# Patient Record
Sex: Female | Born: 1985 | Race: White | Hispanic: No | Marital: Married | Smoking: Never smoker
Health system: Southern US, Community
[De-identification: ages and names within clinical notes are randomized; demographics above are authoritative.]

## PROBLEM LIST (undated history)

## (undated) DIAGNOSIS — R519 Headache, unspecified: Secondary | ICD-10-CM

## (undated) DIAGNOSIS — G43909 Migraine, unspecified, not intractable, without status migrainosus: Secondary | ICD-10-CM

## (undated) DIAGNOSIS — Z808 Family history of malignant neoplasm of other organs or systems: Secondary | ICD-10-CM

## (undated) DIAGNOSIS — R51 Headache: Secondary | ICD-10-CM

## (undated) DIAGNOSIS — E039 Hypothyroidism, unspecified: Secondary | ICD-10-CM

## (undated) DIAGNOSIS — T7840XA Allergy, unspecified, initial encounter: Secondary | ICD-10-CM

## (undated) DIAGNOSIS — J45909 Unspecified asthma, uncomplicated: Secondary | ICD-10-CM

## (undated) HISTORY — DX: Family history of malignant neoplasm of other organs or systems: Z80.8

## (undated) HISTORY — DX: Headache: R51

## (undated) HISTORY — DX: Migraine, unspecified, not intractable, without status migrainosus: G43.909

## (undated) HISTORY — PX: WISDOM TOOTH EXTRACTION: SHX21

## (undated) HISTORY — DX: Headache, unspecified: R51.9

## (undated) HISTORY — DX: Allergy, unspecified, initial encounter: T78.40XA

---
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
## 2010-03-30 ENCOUNTER — Encounter: Admission: RE | Admit: 2010-03-30 | Discharge: 2010-03-30 | Payer: Self-pay | Admitting: Obstetrics and Gynecology

## 2010-10-25 ENCOUNTER — Encounter
Admission: RE | Admit: 2010-10-25 | Discharge: 2010-10-25 | Payer: Self-pay | Source: Home / Self Care | Attending: Obstetrics and Gynecology | Admitting: Obstetrics and Gynecology

## 2011-02-27 ENCOUNTER — Inpatient Hospital Stay (INDEPENDENT_AMBULATORY_CARE_PROVIDER_SITE_OTHER)
Admission: RE | Admit: 2011-02-27 | Discharge: 2011-02-27 | Disposition: A | Discharge status: Home / Self Care | Payer: 59 | Source: Ambulatory Visit | Attending: Family Medicine | Admitting: Family Medicine

## 2011-02-27 DIAGNOSIS — N390 Urinary tract infection, site not specified: Secondary | ICD-10-CM

## 2011-02-27 LAB — POCT PREGNANCY, URINE: Preg Test, Ur: NEGATIVE

## 2011-02-27 LAB — POCT URINALYSIS DIP (DEVICE)
Glucose, UA: 100 mg/dL — AB
Nitrite: POSITIVE — AB
Urobilinogen, UA: 2 mg/dL — ABNORMAL HIGH (ref 0.0–1.0)

## 2011-08-24 ENCOUNTER — Other Ambulatory Visit: Payer: Self-pay | Admitting: Obstetrics and Gynecology

## 2011-08-24 DIAGNOSIS — D249 Benign neoplasm of unspecified breast: Secondary | ICD-10-CM

## 2011-09-01 ENCOUNTER — Ambulatory Visit
Admission: RE | Admit: 2011-09-01 | Discharge: 2011-09-01 | Disposition: A | Discharge status: Home / Self Care | Payer: 59 | Source: Ambulatory Visit | Attending: Obstetrics and Gynecology | Admitting: Obstetrics and Gynecology

## 2011-09-01 DIAGNOSIS — D249 Benign neoplasm of unspecified breast: Secondary | ICD-10-CM

## 2011-09-22 ENCOUNTER — Encounter: Payer: Self-pay | Admitting: *Deleted

## 2011-09-22 ENCOUNTER — Emergency Department (HOSPITAL_COMMUNITY)
Admission: EM | Admit: 2011-09-22 | Discharge: 2011-09-22 | Disposition: A | Discharge status: Home / Self Care | Payer: 59 | Source: Home / Self Care | Attending: Family Medicine | Admitting: Family Medicine

## 2011-09-22 DIAGNOSIS — J020 Streptococcal pharyngitis: Secondary | ICD-10-CM

## 2011-09-22 LAB — POCT RAPID STREP A: Streptococcus, Group A Screen (Direct): POSITIVE — AB

## 2011-09-22 MED ORDER — GUAIFENESIN-CODEINE 100-10 MG/5ML PO SYRP: 5.0000 mL | ORAL_SOLUTION | Freq: Three times a day (TID) | ORAL | Status: AC | PRN

## 2011-09-22 MED ORDER — AZITHROMYCIN 250 MG PO TABS: ORAL_TABLET | ORAL | Status: AC

## 2011-09-22 NOTE — ED Notes (Signed)
Pt  Has  Symptoms  Of  sorethroat        X  5  Days         Cough /  Congested         Runny  Nose  /  Sneezing

## 2011-09-22 NOTE — ED Provider Notes (Signed)
History     CSN: [id number] Arrival date & time: 09/22/2011  3:43 PM   None     No chief complaint on file.   (Consider location/radiation/quality/duration/timing/severity/associated sxs/prior treatment) Patient is a 25 y.o. female presenting with pharyngitis.  Sore Throat This is a new problem. The current episode started more than 2 days ago. The problem occurs constantly. The problem has been gradually worsening. The symptoms are relieved by nothing.    No past medical history on file.  No past surgical history on file.  No family history on file.  History  Substance Use Topics  . Smoking status: Not on file  . Smokeless tobacco: Not on file  . Alcohol Use: Not on file    OB History    No data available      Review of Systems  Constitutional: Negative.   HENT: Positive for congestion, sore throat and postnasal drip.   Respiratory: Negative.   Cardiovascular: Negative.   Gastrointestinal: Negative.   Skin: Negative.     Allergies  Review of patient's allergies indicates not on file.  Home Medications  No current outpatient prescriptions on file.  There were no vitals taken for this visit.  Physical Exam  Nursing note and vitals reviewed. Constitutional: She appears well-developed and well-nourished.  HENT:  Head: Normocephalic.  Right Ear: External ear normal.  Left Ear: External ear normal.  Mouth/Throat: Mucous membranes are normal. Posterior oropharyngeal erythema present. No oropharyngeal exudate, posterior oropharyngeal edema or tonsillar abscesses.    ED Course  Procedures (including critical care time)  Labs Reviewed - No data to display No results found.   No diagnosis found.    MDM  Strep pos.        Barkley Bruns, MD 09/22/11 [phone]

## 2012-07-25 ENCOUNTER — Other Ambulatory Visit: Payer: Self-pay

## 2012-07-25 LAB — OB RESULTS CONSOLE RUBELLA ANTIBODY, IGM: Rubella: IMMUNE

## 2012-07-25 LAB — OB RESULTS CONSOLE HEPATITIS B SURFACE ANTIGEN: Hepatitis B Surface Ag: NEGATIVE

## 2012-07-25 LAB — OB RESULTS CONSOLE GC/CHLAMYDIA
Chlamydia: NEGATIVE
Gonorrhea: NEGATIVE

## 2012-07-25 LAB — OB RESULTS CONSOLE ANTIBODY SCREEN: Antibody Screen: NEGATIVE

## 2012-10-07 ENCOUNTER — Other Ambulatory Visit (HOSPITAL_COMMUNITY): Payer: Self-pay | Admitting: Obstetrics and Gynecology

## 2012-10-07 DIAGNOSIS — Z3689 Encounter for other specified antenatal screening: Secondary | ICD-10-CM

## 2012-10-11 ENCOUNTER — Ambulatory Visit (HOSPITAL_COMMUNITY)
Admission: RE | Admit: 2012-10-11 | Discharge: 2012-10-11 | Disposition: A | Discharge status: Home / Self Care | Payer: 59 | Source: Ambulatory Visit | Attending: Obstetrics and Gynecology | Admitting: Obstetrics and Gynecology

## 2012-10-11 ENCOUNTER — Encounter (HOSPITAL_COMMUNITY): Payer: Self-pay

## 2012-10-11 DIAGNOSIS — O3440 Maternal care for other abnormalities of cervix, unspecified trimester: Secondary | ICD-10-CM | POA: Insufficient documentation

## 2012-10-11 DIAGNOSIS — Z363 Encounter for antenatal screening for malformations: Secondary | ICD-10-CM | POA: Insufficient documentation

## 2012-10-11 DIAGNOSIS — Z1389 Encounter for screening for other disorder: Secondary | ICD-10-CM | POA: Insufficient documentation

## 2012-10-11 DIAGNOSIS — O358XX0 Maternal care for other (suspected) fetal abnormality and damage, not applicable or unspecified: Secondary | ICD-10-CM | POA: Insufficient documentation

## 2012-10-11 DIAGNOSIS — Z3689 Encounter for other specified antenatal screening: Secondary | ICD-10-CM

## 2012-10-11 NOTE — Progress Notes (Signed)
Ms. Allison Hicks was seen for ultrasound appointment today.  Please see [facility] report for details.

## 2013-03-03 ENCOUNTER — Inpatient Hospital Stay (HOSPITAL_COMMUNITY)
Admission: AD | Admit: 2013-03-03 | Discharge: 2013-03-07 | DRG: 775 | Disposition: A | Discharge status: Home / Self Care | Payer: 59 | Source: Ambulatory Visit | Attending: Obstetrics and Gynecology | Admitting: Obstetrics and Gynecology

## 2013-03-03 ENCOUNTER — Encounter (HOSPITAL_COMMUNITY): Payer: Self-pay | Admitting: *Deleted

## 2013-03-03 HISTORY — DX: Unspecified asthma, uncomplicated: J45.909

## 2013-03-03 LAB — CBC
MCH: 31.2 pg (ref 26.0–34.0)
Platelets: 164 10*3/uL (ref 150–400)
RBC: 4.36 MIL/uL (ref 3.87–5.11)
WBC: 10.7 10*3/uL — ABNORMAL HIGH (ref 4.0–10.5)

## 2013-03-03 MED ORDER — LIDOCAINE HCL (PF) 1 % IJ SOLN
30.0000 mL | INTRAMUSCULAR | Status: AC | PRN
  Administered 2013-03-05: 30 mL via SUBCUTANEOUS
  Filled 2013-03-03 (×2): qty 30

## 2013-03-03 MED ORDER — OXYCODONE-ACETAMINOPHEN 5-325 MG PO TABS: 1.0000 | ORAL_TABLET | ORAL | Status: DC | PRN

## 2013-03-03 MED ORDER — OXYTOCIN 40 UNITS IN LACTATED RINGERS INFUSION - SIMPLE MED: 62.5000 mL/h | INTRAVENOUS | Status: DC

## 2013-03-03 MED ORDER — LACTATED RINGERS IV SOLN
INTRAVENOUS | Status: DC
  Administered 2013-03-03 – 2013-03-05 (×4): via INTRAVENOUS

## 2013-03-03 MED ORDER — TERBUTALINE SULFATE 1 MG/ML IJ SOLN: 0.2500 mg | Freq: Once | INTRAMUSCULAR | Status: AC | PRN

## 2013-03-03 MED ORDER — OXYTOCIN 40 UNITS IN LACTATED RINGERS INFUSION - SIMPLE MED
1.0000 m[IU]/min | INTRAVENOUS | Status: DC
  Administered 2013-03-04: 2 m[IU]/min via INTRAVENOUS
  Filled 2013-03-03: qty 1000

## 2013-03-03 MED ORDER — IBUPROFEN 600 MG PO TABS: 600.0000 mg | ORAL_TABLET | Freq: Four times a day (QID) | ORAL | Status: DC | PRN

## 2013-03-03 MED ORDER — ACETAMINOPHEN 325 MG PO TABS
650.0000 mg | ORAL_TABLET | ORAL | Status: DC | PRN
  Administered 2013-03-04 (×2): 650 mg via ORAL
  Filled 2013-03-03 (×2): qty 2

## 2013-03-03 MED ORDER — ONDANSETRON HCL 4 MG/2ML IJ SOLN
4.0000 mg | Freq: Four times a day (QID) | INTRAMUSCULAR | Status: DC | PRN
  Administered 2013-03-04 (×2): 4 mg via INTRAVENOUS
  Filled 2013-03-03 (×2): qty 2

## 2013-03-03 MED ORDER — OXYTOCIN BOLUS FROM INFUSION
500.0000 mL | INTRAVENOUS | Status: DC
  Administered 2013-03-05: 500 mL via INTRAVENOUS

## 2013-03-03 MED ORDER — LACTATED RINGERS IV SOLN: 500.0000 mL | INTRAVENOUS | Status: DC | PRN

## 2013-03-03 MED ORDER — MISOPROSTOL 25 MCG QUARTER TABLET
25.0000 ug | ORAL_TABLET | ORAL | Status: AC | PRN
  Administered 2013-03-03 (×2): 25 ug via VAGINAL
  Filled 2013-03-03 (×2): qty 0.25

## 2013-03-03 MED ORDER — CITRIC ACID-SODIUM CITRATE 334-500 MG/5ML PO SOLN
30.0000 mL | ORAL | Status: DC | PRN
  Filled 2013-03-03: qty 15

## 2013-03-03 MED ORDER — ZOLPIDEM TARTRATE 5 MG PO TABS: 5.0000 mg | ORAL_TABLET | Freq: Every evening | ORAL | Status: DC | PRN

## 2013-03-04 ENCOUNTER — Inpatient Hospital Stay (HOSPITAL_COMMUNITY): Payer: 59 | Admitting: Anesthesiology

## 2013-03-04 ENCOUNTER — Encounter (HOSPITAL_COMMUNITY): Payer: Self-pay | Admitting: Anesthesiology

## 2013-03-04 LAB — RPR: RPR Ser Ql: NONREACTIVE

## 2013-03-04 MED ORDER — EPHEDRINE 5 MG/ML INJ
10.0000 mg | INTRAVENOUS | Status: DC | PRN
  Filled 2013-03-04: qty 2

## 2013-03-04 MED ORDER — LIDOCAINE HCL (PF) 1 % IJ SOLN
INTRAMUSCULAR | Status: DC | PRN
  Administered 2013-03-04 (×2): 5 mL

## 2013-03-04 MED ORDER — LACTATED RINGERS IV SOLN
500.0000 mL | Freq: Once | INTRAVENOUS | Status: AC
  Administered 2013-03-04: 15:00:00 via INTRAVENOUS

## 2013-03-04 MED ORDER — DIPHENHYDRAMINE HCL 50 MG/ML IJ SOLN: 12.5000 mg | INTRAMUSCULAR | Status: DC | PRN

## 2013-03-04 MED ORDER — PHENYLEPHRINE 40 MCG/ML (10ML) SYRINGE FOR IV PUSH (FOR BLOOD PRESSURE SUPPORT)
80.0000 ug | PREFILLED_SYRINGE | INTRAVENOUS | Status: DC | PRN
  Filled 2013-03-04: qty 2
  Filled 2013-03-04: qty 5

## 2013-03-04 MED ORDER — EPHEDRINE 5 MG/ML INJ
10.0000 mg | INTRAVENOUS | Status: DC | PRN
  Filled 2013-03-04: qty 4
  Filled 2013-03-04: qty 2

## 2013-03-04 MED ORDER — PHENYLEPHRINE 40 MCG/ML (10ML) SYRINGE FOR IV PUSH (FOR BLOOD PRESSURE SUPPORT)
80.0000 ug | PREFILLED_SYRINGE | INTRAVENOUS | Status: DC | PRN
  Filled 2013-03-04: qty 2

## 2013-03-04 MED ORDER — FENTANYL 2.5 MCG/ML BUPIVACAINE 1/10 % EPIDURAL INFUSION (WH - ANES)
14.0000 mL/h | INTRAMUSCULAR | Status: DC | PRN
  Administered 2013-03-04 – 2013-03-05 (×3): 14 mL/h via EPIDURAL
  Filled 2013-03-04 (×3): qty 125

## 2013-03-04 MED ORDER — BUTORPHANOL TARTRATE 1 MG/ML IJ SOLN: 1.0000 mg | INTRAMUSCULAR | Status: DC | PRN

## 2013-03-04 NOTE — Progress Notes (Signed)
Cx 3/C/-2 FHT reactive UCs q2-4 min

## 2013-03-04 NOTE — Progress Notes (Signed)
SROM about 11 am, clear FHT reactive UCs about q2-3 min Pitocin on Cx 1/80/-3/vtx/soft D/W patient and husband-continue present treatment

## 2013-03-04 NOTE — Anesthesia Preprocedure Evaluation (Signed)

## 2013-03-04 NOTE — Progress Notes (Signed)
Cx 2/90/-3 IUPC placed FHT reactive

## 2013-03-04 NOTE — Anesthesia Procedure Notes (Signed)
Epidural Patient location during procedure: OB Start time: 03/04/2013 3:10 PM  Staffing Anesthesiologist: Angus Seller., Harrell Gave. Performed by: anesthesiologist   Preanesthetic Checklist Completed: patient identified, site marked, surgical consent, pre-op evaluation, timeout performed, IV checked, risks and benefits discussed and monitors and equipment checked  Epidural Patient position: sitting Prep: site prepped and draped and DuraPrep Patient monitoring: continuous pulse ox and blood pressure Approach: midline Injection technique: LOR air and LOR saline  Needle:  Needle type: Tuohy  Needle gauge: 17 G Needle length: 9 cm and 9 Needle insertion depth: 5 cm cm Catheter type: closed end flexible Catheter size: 19 Gauge Catheter at skin depth: 10 cm Test dose: negative  Assessment Events: blood not aspirated, injection not painful, no injection resistance, negative IV test and no paresthesia  Additional Notes Patient identified.  Risk benefits discussed including failed block, incomplete pain control, headache, nerve damage, paralysis, blood pressure changes, nausea, vomiting, reactions to medication both toxic or allergic, and postpartum back pain.  Patient expressed understanding and wished to proceed.  All questions were answered.  Sterile technique used throughout procedure and epidural site dressed with sterile barrier dressing. No paresthesia or other complications noted.The patient did not experience any signs of intravascular injection such as tinnitus or metallic taste in mouth nor signs of intrathecal spread such as rapid motor block. Please see nursing notes for vital signs.

## 2013-03-04 NOTE — Progress Notes (Signed)
Cx 4/C (slightly puffy at 9:00) FHT reactive UCs q2-3 min, IUPC flushed Check cx 1 hour

## 2013-03-04 NOTE — H&P (Signed)
Allison Hicks is a 27 y.o. female presenting for IOL. No HA, no vision change, no epigastric pain, no ROM/bleeding. Maternal Medical History:  Fetal activity: Perceived fetal activity is normal.      OB History   Grav Para Term Preterm Abortions TAB SAB Ect Mult Living   1              Past Medical History  Diagnosis Date  . Asthma    No past surgical history on file. Family History: family history is not on file. Social History:  reports that she has never smoked. She does not have any smokeless tobacco history on file. She reports that she does not drink alcohol or use illicit drugs.   Prenatal Transfer Tool  Maternal Diabetes: No Genetic Screening: Normal Maternal Ultrasounds/Referrals: Normal Fetal Ultrasounds or other Referrals:  None Maternal Substance Abuse:  No Significant Maternal Medications:  None Significant Maternal Lab Results:  None Other Comments:  None  Review of Systems  Eyes: Negative for blurred vision.  Gastrointestinal: Negative for abdominal pain.  Neurological: Negative for headaches.    Dilation: Fingertip Effacement (%): Thick Station: -2 Exam by:: L. Dupell, RN Blood pressure 114/73, pulse 80, temperature 98.1 F (36.7 C), temperature source Oral, resp. rate 20, height 5\' 6"  (1.676 m), weight 180 lb (81.647 kg), last menstrual period 05/25/2012. Maternal Exam:  Uterine Assessment: Contraction strength is moderate.  Contraction frequency is irregular.   Abdomen: Fetal presentation: vertex     Fetal Exam Fetal Monitor Review: Pattern: accelerations present.       Physical Exam  Cardiovascular: Normal rate and regular rhythm.   Respiratory: Effort normal and breath sounds normal.  GI: There is no tenderness.  Neurological: She has normal reflexes.    Prenatal labs: ABO, Rh: O/Positive/-- (09/19 0000) Antibody: Negative (09/19 0000) Rubella: Immune (09/19 0000) RPR: NON REACTIVE (04/28 1820)  HBsAg: Negative (09/19 0000)   HIV: Non-reactive (09/19 0000)  GBS: Negative (04/28 0000)   Assessment/Plan: 27 yo G1P0 at 40 3/7 weeks for IOL Two stage induction D/W patient and husband-risks reviewed including hyperstimulation, fetal distress, emergency cesarean section and failed induction. S/P cytotec x 2 last pm, now on pitocin   Allison Hicks,Huntley Demedeiros E 03/04/2013, 7:53 AM

## 2013-03-05 ENCOUNTER — Encounter (HOSPITAL_COMMUNITY): Payer: Self-pay | Admitting: *Deleted

## 2013-03-05 MED ORDER — METHYLERGONOVINE MALEATE 0.2 MG PO TABS: 0.2000 mg | ORAL_TABLET | ORAL | Status: DC | PRN

## 2013-03-05 MED ORDER — ZOLPIDEM TARTRATE 5 MG PO TABS: 5.0000 mg | ORAL_TABLET | Freq: Every evening | ORAL | Status: DC | PRN

## 2013-03-05 MED ORDER — DIPHENHYDRAMINE HCL 25 MG PO CAPS: 25.0000 mg | ORAL_CAPSULE | Freq: Four times a day (QID) | ORAL | Status: DC | PRN

## 2013-03-05 MED ORDER — ONDANSETRON HCL 4 MG/2ML IJ SOLN: 4.0000 mg | INTRAMUSCULAR | Status: DC | PRN

## 2013-03-05 MED ORDER — PRENATAL MULTIVITAMIN CH
1.0000 | ORAL_TABLET | Freq: Every day | ORAL | Status: DC
  Administered 2013-03-05 – 2013-03-06 (×2): 1 via ORAL
  Filled 2013-03-05 (×3): qty 1

## 2013-03-05 MED ORDER — BISACODYL 10 MG RE SUPP: 10.0000 mg | Freq: Every day | RECTAL | Status: DC | PRN

## 2013-03-05 MED ORDER — BENZOCAINE-MENTHOL 20-0.5 % EX AERO
1.0000 "application " | INHALATION_SPRAY | CUTANEOUS | Status: DC | PRN
  Administered 2013-03-05: 1 via TOPICAL
  Filled 2013-03-05: qty 56

## 2013-03-05 MED ORDER — ONDANSETRON HCL 4 MG PO TABS: 4.0000 mg | ORAL_TABLET | ORAL | Status: DC | PRN

## 2013-03-05 MED ORDER — DIBUCAINE 1 % RE OINT: 1.0000 "application " | TOPICAL_OINTMENT | RECTAL | Status: DC | PRN

## 2013-03-05 MED ORDER — WITCH HAZEL-GLYCERIN EX PADS: 1.0000 "application " | MEDICATED_PAD | CUTANEOUS | Status: DC | PRN

## 2013-03-05 MED ORDER — FLEET ENEMA 7-19 GM/118ML RE ENEM: 1.0000 | ENEMA | Freq: Every day | RECTAL | Status: DC | PRN

## 2013-03-05 MED ORDER — LANOLIN HYDROUS EX OINT: TOPICAL_OINTMENT | CUTANEOUS | Status: DC | PRN

## 2013-03-05 MED ORDER — SIMETHICONE 80 MG PO CHEW
80.0000 mg | CHEWABLE_TABLET | ORAL | Status: DC | PRN
Start: 2013-03-05 — End: 2013-03-07

## 2013-03-05 MED ORDER — OXYCODONE-ACETAMINOPHEN 5-325 MG PO TABS
1.0000 | ORAL_TABLET | ORAL | Status: DC | PRN
  Administered 2013-03-06 – 2013-03-07 (×2): 1 via ORAL
  Filled 2013-03-05: qty 1

## 2013-03-05 MED ORDER — METHYLERGONOVINE MALEATE 0.2 MG/ML IJ SOLN: 0.2000 mg | INTRAMUSCULAR | Status: DC | PRN

## 2013-03-05 MED ORDER — IBUPROFEN 600 MG PO TABS
600.0000 mg | ORAL_TABLET | Freq: Four times a day (QID) | ORAL | Status: DC
  Administered 2013-03-05 – 2013-03-07 (×9): 600 mg via ORAL
  Filled 2013-03-05 (×9): qty 1

## 2013-03-05 MED ORDER — SENNOSIDES-DOCUSATE SODIUM 8.6-50 MG PO TABS
2.0000 | ORAL_TABLET | Freq: Every day | ORAL | Status: DC
  Administered 2013-03-05 – 2013-03-06 (×2): 2 via ORAL

## 2013-03-05 MED ORDER — TETANUS-DIPHTH-ACELL PERTUSSIS 5-2.5-18.5 LF-MCG/0.5 IM SUSP: 0.5000 mL | Freq: Once | INTRAMUSCULAR | Status: DC

## 2013-03-05 NOTE — Progress Notes (Signed)
Dr Henderson Cloud called for update, pt complete and pushing, to call when ready for delivery

## 2013-03-05 NOTE — Anesthesia Postprocedure Evaluation (Signed)
  Anesthesia Post-op Note  Patient: Allison Hicks  Procedure(s) Performed: * No procedures listed *  Patient Location: PACU and Mother/Baby  Anesthesia Type:Epidural  Level of Consciousness: awake, alert  and oriented  Airway and Oxygen Therapy: Patient Spontanous Breathing  Post-op Pain: none  Post-op Assessment: Post-op Vital signs reviewed, Patient's Cardiovascular Status Stable, No headache, No backache, No residual numbness and No residual motor weakness  Post-op Vital Signs: Reviewed and stable  Complications: No apparent anesthesia complications Anesthesia Post-op Note  Patient: Allison Hicks  Procedure(s) Performed: * No procedures listed *  Patient Location: PACU and Mother/Baby  Anesthesia Type:Epidural  Level of Consciousness: awake, alert  and oriented  Airway and Oxygen Therapy: Patient Spontanous Breathing  Post-op Pain: none  Post-op Assessment: Post-op Vital signs reviewed, Patient's Cardiovascular Status Stable, No headache, No backache, No residual numbness and No residual motor weakness  Post-op Vital Signs: Reviewed and stable  Complications: No apparent anesthesia complications

## 2013-03-05 NOTE — Progress Notes (Signed)
Operative Delivery Note At 6:56 AM a viable female was delivered via Vaginal, Vacuum Investment banker, operational).  Presentation: vertex; Position: Left,, Occiput,, Anterior; Station: +3.  Verbal consent: obtained from patient.  Risks and benefits discussed in detail.  Risks include, but are not limited to the risks of anesthesia, bleeding, infection, damage to maternal tissues, fetal cephalhematoma.  There is also the risk of inability to effect vaginal delivery of the head, or shoulder dystocia that cannot be resolved by established maneuvers, leading to the need for emergency cesarean section.  APGAR: 9, 9; weight .   Placenta status: Intact, Spontaneous.   Cord: 3 vessels with the following complications: None.  Cord pH: pending  Anesthesia: Epidural  Instruments: Kiwi Episiotomy: Median Lacerations: 2nd degree Suture Repair: vicryl rapide Est. Blood Loss (mL): 350  Mom to postpartum.  Baby to nursery-stable.  Allison Hicks,Allison Hicks E 03/05/2013, 7:24 AM

## 2013-03-05 NOTE — Progress Notes (Signed)
Dr Henderson Cloud at bedside discussing with pt risks and benefits of vacuum assisted delivery, pt verbalizes and understands, to proceed with vacuum delivery, see dr note.

## 2013-03-06 ENCOUNTER — Encounter (HOSPITAL_COMMUNITY): Payer: Self-pay | Admitting: *Deleted

## 2013-03-06 LAB — CBC
HCT: 32.8 % — ABNORMAL LOW (ref 36.0–46.0)
Hemoglobin: 11.1 g/dL — ABNORMAL LOW (ref 12.0–15.0)
MCV: 90.4 fL (ref 78.0–100.0)
RBC: 3.63 MIL/uL — ABNORMAL LOW (ref 3.87–5.11)
RDW: 13.3 % (ref 11.5–15.5)
WBC: 15.7 10*3/uL — ABNORMAL HIGH (ref 4.0–10.5)

## 2013-03-06 NOTE — Progress Notes (Signed)
Post Partum Day 1 Subjective: no complaints, up ad lib, voiding, tolerating PO and + flatus  Objective: Blood pressure 101/68, pulse 72, temperature 97.9 F (36.6 C), temperature source Oral, resp. rate 17, height 5\' 6"  (1.676 m), weight 180 lb (81.647 kg), last menstrual period 05/25/2012, SpO2 100.00%, unknown if currently breastfeeding.  Physical Exam:  General: alert, cooperative and appears stated age Lochia: appropriate Uterine Fundus: firm Incision: perineum intact DVT Evaluation: No evidence of DVT seen on physical exam. Negative Homan's sign. No cords or calf tenderness. No significant calf/ankle edema.   Recent Labs  03/03/13 1820 03/06/13 0615  HGB 13.6 11.1*  HCT 38.7 32.8*    Assessment/Plan: Plan for discharge tomorrow and Circumcision prior to discharge   LOS: 3 days   Jalal Rauch G 03/06/2013, 7:56 AM

## 2013-03-07 MED ORDER — IBUPROFEN 600 MG PO TABS: 600.0000 mg | ORAL_TABLET | Freq: Four times a day (QID) | ORAL | Status: DC

## 2013-03-07 NOTE — Discharge Summary (Signed)
Obstetric Discharge Summary Reason for Admission: induction of labor Prenatal Procedures: ultrasound Intrapartum Procedures: vacuum Postpartum Procedures: none Complications-Operative and Postpartum: 2 degree perineal laceration Hemoglobin  Date Value Range Status  03/06/2013 11.1* 12.0 - 15.0 g/dL Final     HCT  Date Value Range Status  03/06/2013 32.8* 36.0 - 46.0 % Final    Physical Exam:  General: alert, cooperative and appears stated age Lochia: appropriate Uterine Fundus: firm Incision: perineum intact DVT Evaluation: No evidence of DVT seen on physical exam. Negative Homan's sign. No cords or calf tenderness. No significant calf/ankle edema.  Discharge Diagnoses: Term Pregnancy-delivered  Discharge Information: Date: 03/07/2013 Activity: pelvic rest Diet: routine Medications: None and Ibuprofen Condition: stable Instructions: refer to practice specific booklet Discharge to: home, desires circ prior to discharge   Newborn Data: Live born female  Birth Weight: 8 lb 2.4 oz (3697 g) APGAR: 9, 9  Home with mother.  CURTIS,CAROL G 03/07/2013, 7:56 AM

## 2013-03-10 NOTE — Progress Notes (Signed)
Post discharge chart review completed.  

## 2013-03-18 ENCOUNTER — Ambulatory Visit (HOSPITAL_COMMUNITY)
Admission: RE | Admit: 2013-03-18 | Discharge: 2013-03-18 | Disposition: A | Discharge status: Home / Self Care | Payer: 59 | Source: Ambulatory Visit | Attending: Obstetrics and Gynecology | Admitting: Obstetrics and Gynecology

## 2013-03-18 NOTE — Lactation Note (Signed)
Adult Lactation Consultation Outpatient Visit Note  Patient Name: Allison Hicks       "Samuel Bouche"                                                                 todays weight:8-13.8,4020 Date of Birth: 03/08/1986 Gestational Age at Delivery: [redacted]w[redacted]d Type of Delivery:   Breastfeeding History: Frequency of Breastfeeding: offers breast once daily for a few mins Length of Feeding:  Voids: 15 Stools:3 yellow brown   Supplementing / Method: Pumping:  Type of Pump:Medela advance   Frequency:every 4 hours  Volume:  4 ounces  Comments:Infant is 24 weeks old and one day. Infant was jaundice and on bili blanket. Infant is bili count is now under 12. jaundice is still observed on face and in eyes. Mother here today for assistance with difficult latch. Mother purchased a #20 and #24 nipple shields. She has been using them for several days. She describes severe nipple pain when latching infant. Mother nipples are very pink. No cracking observed. Mothers breast are filling but not firm. She states she pumps every 3-4 hours. She states that pumping is painful.   Consultation Evaluation: Mother assisted with latch using a #24 nipple shield. Infant unable to sustain deep latch. Place #20 nipple shield on and infant latched for 10-58mins on and off and transferred 10 ml. Latched much deeper with #20 NS. Attempt to latch infant without shield. Unable to sustain comfortable latch. When infant comes off, mothers nipple is blanched and pinched.   Initial Feeding Assessment: Pre-feed Weight:4020 Post-feed Weight:4030 Amount Transferred:39ml Comments:  Additional Feeding Assessment: infant fed using SNS and #20 nipple shield. Infant took 30 ml of formula and 22 ml from mothers breast. Mother denied pain with latch. Pre-feed Weight:4030 Post-feed ZOXWRU:0454 Amount Transferred:52 Comments:  Additional Feeding Assessment: infant was given another 40 ml of formula in SNS. Infant took entire 40 ml and 10 ml from  mother. Pre-feed [id number]:1478 Post-feed [id number]:3086 Amount Transferred:35ml Comments:  Total Breast milk Transferred this Visit: 42ml Total Supplement Given: 70ml Total feeding 12 ml  Additional Interventions: Mother encouraged to feed infant offer and early .  inst mother to offer breast using Sns and give at least 60-90 ml of EBM/ formula every 2-3 hours. After breast feeding. Encouraged mother to use good breast support , breast compression and apply nipple shield properly. Mother inst to pump every 2-3 hours for 20-25 mins. inst mother to hand express before and after each pumping to drain more milk from breast.  Suggested power pumping once. Mother to nap frequently and to drink lots of liquids.   Follow-Up  May 20 at 1:00 p,m.    Stevan Born [facility] 03/18/2013, 9:12 AM

## 2013-03-24 ENCOUNTER — Ambulatory Visit (HOSPITAL_COMMUNITY): Payer: 59

## 2013-03-25 ENCOUNTER — Ambulatory Visit (HOSPITAL_COMMUNITY): Payer: 59

## 2013-03-26 ENCOUNTER — Ambulatory Visit (HOSPITAL_COMMUNITY)
Admission: RE | Admit: 2013-03-26 | Discharge: 2013-03-26 | Disposition: A | Discharge status: Home / Self Care | Payer: 59 | Source: Ambulatory Visit | Attending: Obstetrics and Gynecology | Admitting: Obstetrics and Gynecology

## 2013-03-26 NOTE — Lactation Note (Signed)
Adult Lactation Consultation Outpatient Visit Note  Patient Name: Allison Hicks Date of Birth: [birth year] Gestational Age at Delivery: Unknown Type of Delivery:   Breastfeeding History: Frequency of Breastfeeding: Has been bottle feeding EBM and formula Length of Feeding:  Voids: QS Stools: None since Monday night- has already talked with Ped  Supplementing / Method: Pumping:  Type of Pump: Medela PIS   Frequency: q 3-4 hours during the day- none at night  Volume:  4-5 oz  Comments: Suggested mom may want to pump once during the night to promote milk supply. Has to give one bottle of formula at night    Consultation Evaluation:  Initial Feeding Assessment: Pre-feed Weight:9- 8.4  4320 Post-feed Weight: 9- 13.3  4460  Amount Transferred:120 cc's Comments: Attempted to latch Lucas without NS. Mom reports it feels like he is chewing and her nipple is stinging.Nipple pinched when Samuel Bouche comes off the breast. He is very fussy so tried with NS and he did a little better but fussy like he wasn't enough fast enough. Bottle fed EBM to calm baby. Tried both breasts with NS Mom reports that it feels better on the right. Does not look pinched when he comes off the breast. Samuel Bouche took about 3 1/2 oz from bottle. Mom reports that she tried SNS but it was just too much for her- couldn't get it to work well once she got home. Does not want to try it again. Plans to continue attempting at the breast with NS and will bottle feed EBM.      Total Breast milk Transferred this Visit:approx 34 cc's  Total Supplement Given: approx 3 1/2 oz  Additional Interventions: Plans to attend [facility] for weight checks and support. Has started taking Fen[place] since yesterday. No further questions at present. To call prn  Follow-Up With Ped     Pamelia Hoit 03/26/2013, 2:16 PM

## 2014-06-24 LAB — OB RESULTS CONSOLE HIV ANTIBODY (ROUTINE TESTING): HIV: NONREACTIVE

## 2014-06-24 LAB — OB RESULTS CONSOLE ABO/RH: RH TYPE: POSITIVE

## 2014-06-24 LAB — OB RESULTS CONSOLE RPR: RPR: NONREACTIVE

## 2014-06-24 LAB — OB RESULTS CONSOLE GC/CHLAMYDIA
Chlamydia: NEGATIVE
Gonorrhea: NEGATIVE

## 2014-06-24 LAB — OB RESULTS CONSOLE ANTIBODY SCREEN: Antibody Screen: NEGATIVE

## 2014-06-24 LAB — OB RESULTS CONSOLE HEPATITIS B SURFACE ANTIGEN: HEP B S AG: NEGATIVE

## 2014-06-24 LAB — OB RESULTS CONSOLE RUBELLA ANTIBODY, IGM: RUBELLA: IMMUNE

## 2014-09-07 ENCOUNTER — Encounter (HOSPITAL_COMMUNITY): Payer: Self-pay | Admitting: *Deleted

## 2014-09-15 IMAGING — US US OB DETAIL+14 WK
1 series · 12 of 28 positions shown · non-contrast
Comparison: none

[Series 1: us ob detail+14 wk · 0.21mm/px · 12 of 107 slices shown]
[im 4/107]
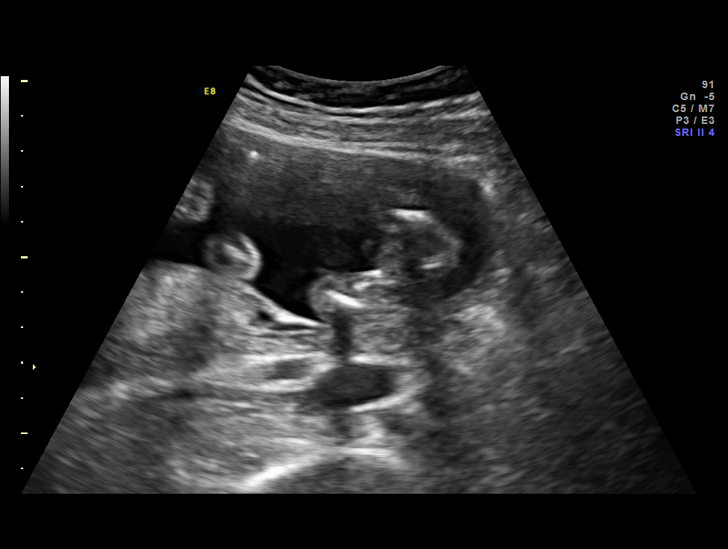
[im 12/107]
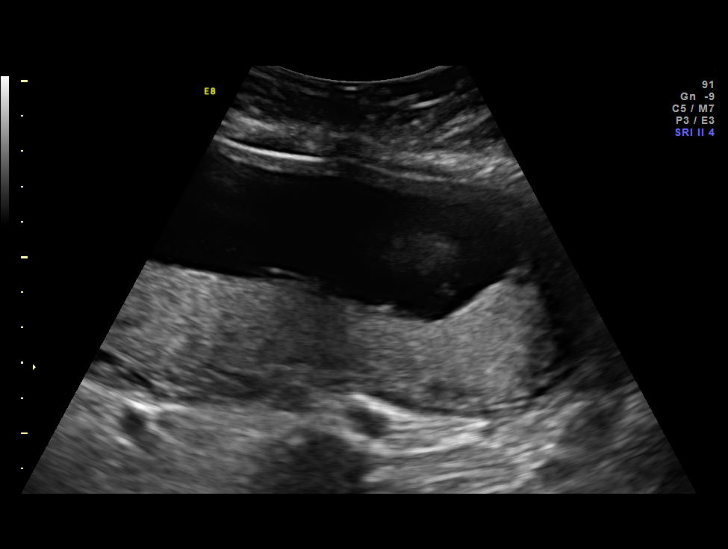
[im 20/107]
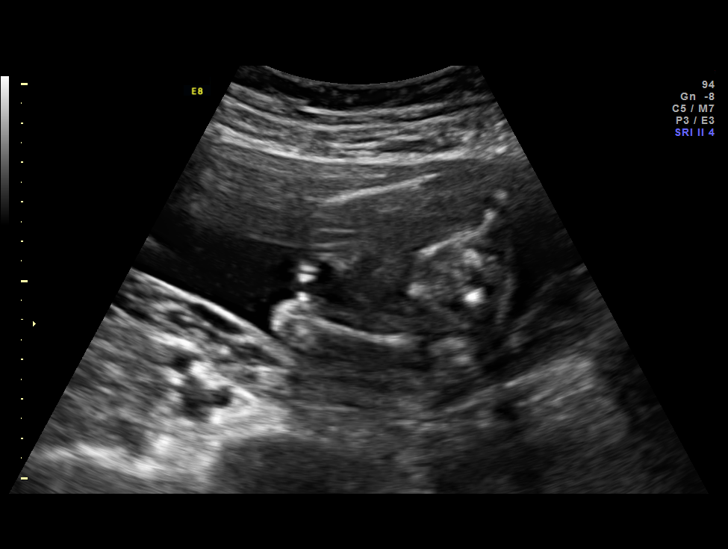
[im 32/107]
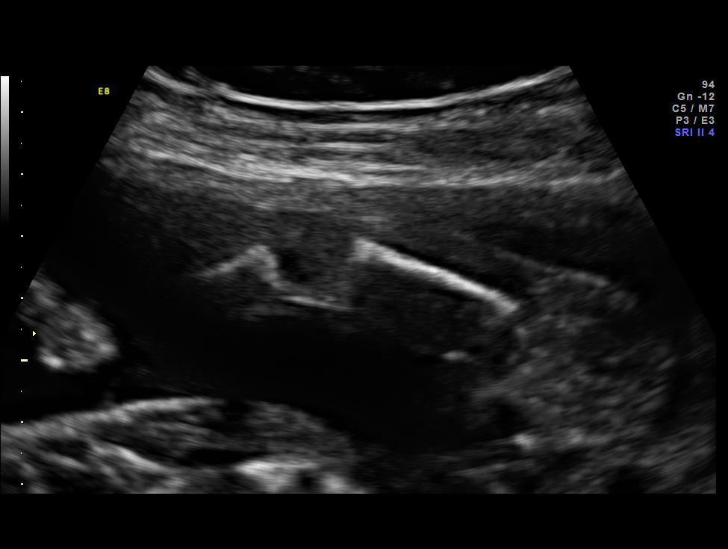
[im 40/107]
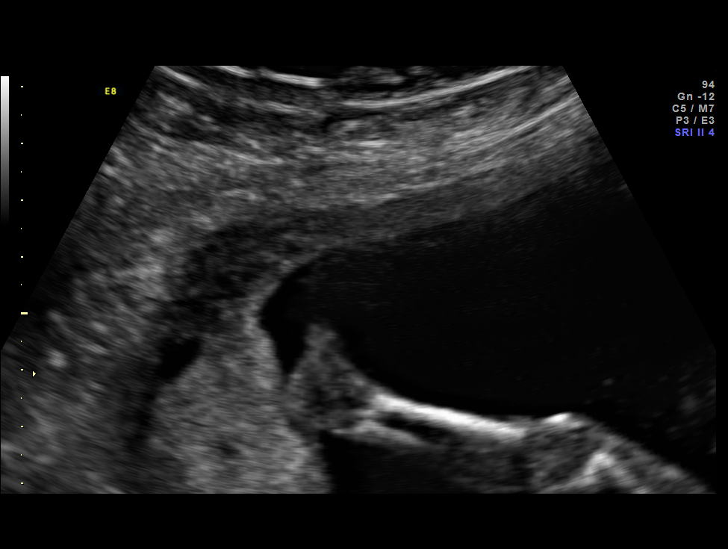
[im 48/107]
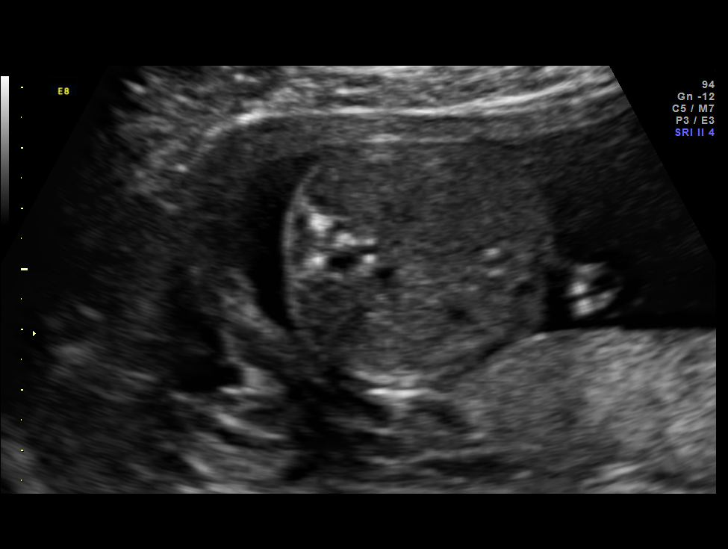
[im 59/107]
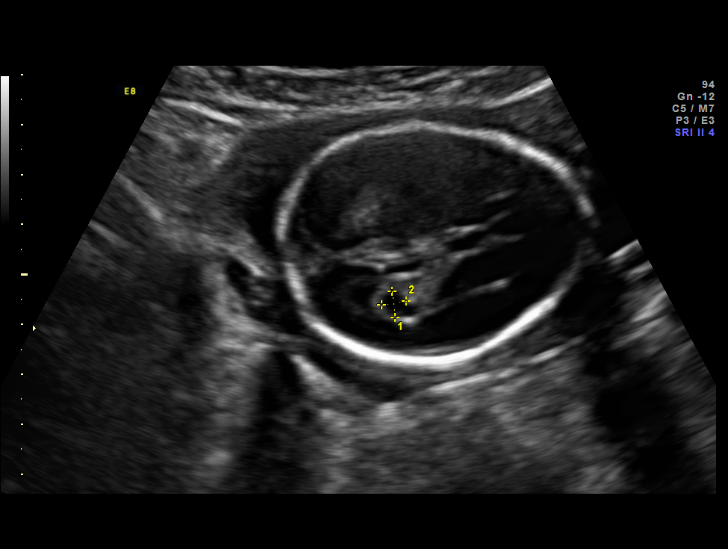
[im 67/107]
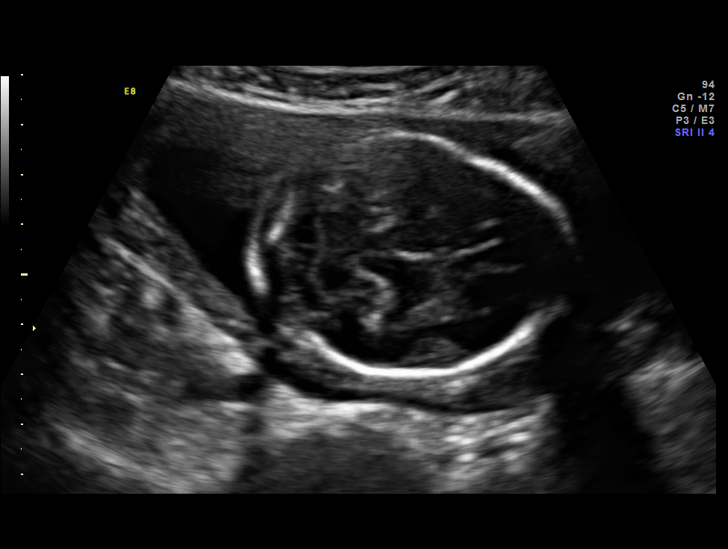
[im 75/107]
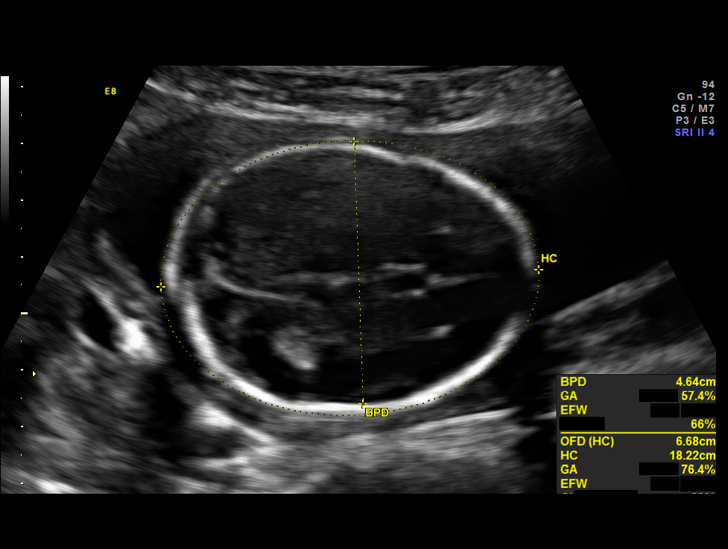
[im 87/107]
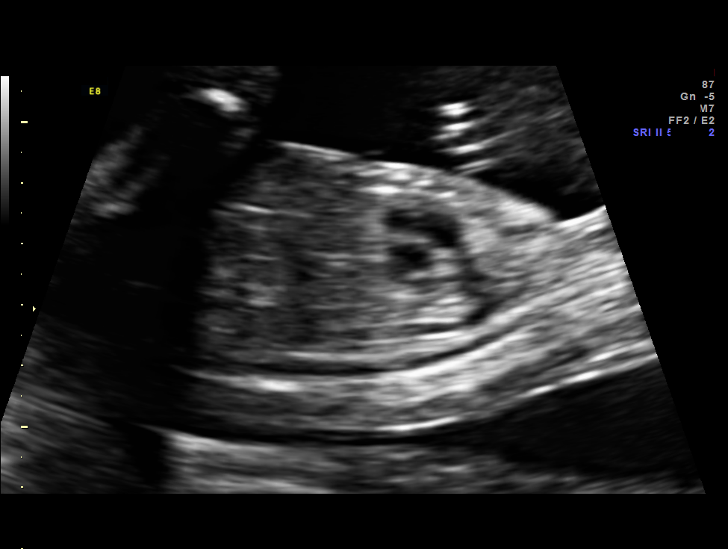
[im 95/107]
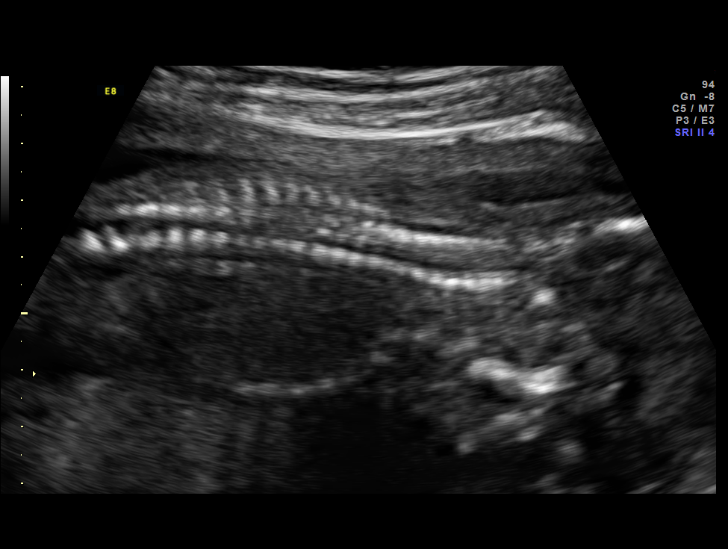
[im 103/107]
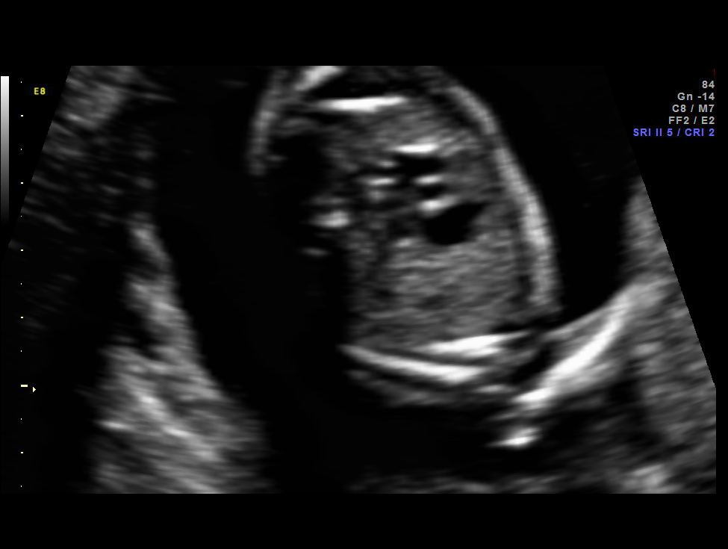

[12 of 28 positions shown; findings below may reference images not displayed]

OBSTETRICS REPORT
                      (Signed Final 10/11/2012 [DATE])

Service(s) Provided

 US OB DETAIL + 14 WK                                  76811.0
Indications

 Detailed fetal anatomic survey
 Fetal abnormality - other known or suspected (
 choriod plexus cyst)
 Previous cervical surgery                             [DATE]
Fetal Evaluation

 Num Of Fetuses:    1
 Fetal Heart Rate:  153                          bpm
 Cardiac Activity:  Observed
 Presentation:      Variable
 Placenta:          Posterior, above cervical
                    os
 P. Cord            Visualized
 Insertion:

 Amniotic Fluid
 AFI FV:      Subjectively within normal limits
                                             Larg Pckt:     5.3  cm
Biometry

 BPD:     46.4  mm     G. Age:  20w 0d                CI:         70.1   70 - 86
 OFD:     66.2  mm                                    FL/HC:      16.4   16.8 -

 HC:     181.3  mm     G. Age:  20w 4d       73  %    HC/AC:      1.18   1.09 -

 AC:     153.6  mm     G. Age:  20w 4d       67  %    FL/BPD:
 FL:      29.8  mm     G. Age:  19w 2d       21  %    FL/AC:      19.4   20 - 24
 HUM:     29.2  mm     G. Age:  19w 4d       44  %
 CER:     20.9  mm     G. Age:  19w 6d       51  %
 NFT:      4.7  mm

 Est. FW:     325  gm    0 lb 11 oz      51  %
Gestational Age

 LMP:           19w 6d        Date:  05/25/12                 EDD:   03/01/13
 U/S Today:     20w 1d                                        EDD:   02/27/13
 Best:          19w 6d     Det. By:  LMP  (05/25/12)          EDD:   03/01/13
Anatomy

 Cranium:          Appears normal         Ductal Arch:      Appears normal
 Fetal Cavum:      Appears normal         Diaphragm:        Appears normal
 Ventricles:       Appears normal         Stomach:          Appears normal, left
                                                            sided
 Choroid Plexus:   Bilateral choroid      Abdomen:          Appears normal
                   plexus cysts
 Cerebellum:       Appears normal         Abdominal Wall:   Appears nml (cord
                                                            insert, abd wall)
 Posterior Fossa:  Appears normal         Cord Vessels:     Appears normal (3
                                                            vessel cord)
 Nuchal Fold:      Appears normal         Kidneys:          Appear normal
 Face:             Appears normal         Bladder:          Appears normal
                   (orbits and profile)
 Lips:             Appears normal         Spine:            Appears normal
 Heart:            Appears normal         Lower             Appears normal
                   (4CH, axis, and        Extremities:
                   situs)
 RVOT:             Appears normal         Upper             Appears normal
                                          Extremities:
 LVOT:             Appears normal

 Other:  Heels and 5th digit appear normal. Fetus appears to be a male.
Targeted Anatomy

 Fetal Central Nervous System
 Cisterna Magna:
Cervix Uterus Adnexa

 Cervical Length:    4        cm

 Cervix:       Normal appearance by transabdominal scan. Appears
               closed, without funnelling.

 Left Ovary:    Within normal limits.
 Right Ovary:   Within normal limits.
Comments

 The patient's fetal anatomic survey is now complete.  Choroid
 plexus cysts were noted.  This is a common variant (1-2%) of
 all pregnancies, and carries a small association with Trisomy
 18.  However, there were no other ultrasound stigmata of
 Trisomy 18, making the possibility of Trisomy 18 less likely.
 No other fetal anomalies or soft markers of aneuploidy were
 seen.  Results of scan discussed with patient, as well as the
 limitations of U/S to detect all fetal anomalies and
 aneuploidies.  No further ultrasounds are required unless
 additional problems arise.
Impression

 Single living intrauterine pregnancy at 19 weeks 6 days.
 Appropriate fetal growth (51%).
 Normal amniotic fluid volume.
 Normal fetal anatomy.
 No fetal anomalies or soft markers of aneuploidy seen.
Recommendations

 Follow-up ultrasounds as clinically indicated.

 questions or concerns.
                [place], [place]

## 2015-01-18 ENCOUNTER — Inpatient Hospital Stay (HOSPITAL_COMMUNITY): Admission: AD | Admit: 2015-01-18 | Payer: 59 | Source: Ambulatory Visit | Admitting: Obstetrics and Gynecology

## 2015-01-18 ENCOUNTER — Encounter (HOSPITAL_COMMUNITY): Payer: Self-pay | Admitting: *Deleted

## 2015-01-18 ENCOUNTER — Telehealth (HOSPITAL_COMMUNITY): Payer: Self-pay | Admitting: *Deleted

## 2015-01-18 LAB — OB RESULTS CONSOLE GBS: GBS: NEGATIVE

## 2015-01-18 NOTE — Telephone Encounter (Signed)
Preadmission screenPreadmission screen 

## 2015-01-20 ENCOUNTER — Inpatient Hospital Stay (HOSPITAL_COMMUNITY)
Admission: RE | Admit: 2015-01-20 | Discharge: 2015-01-22 | DRG: 775 | Disposition: A | Discharge status: Home / Self Care | Payer: 59 | Source: Ambulatory Visit | Attending: Obstetrics and Gynecology | Admitting: Obstetrics and Gynecology

## 2015-01-20 ENCOUNTER — Encounter (HOSPITAL_COMMUNITY): Payer: Self-pay

## 2015-01-20 DIAGNOSIS — Z3490 Encounter for supervision of normal pregnancy, unspecified, unspecified trimester: Secondary | ICD-10-CM

## 2015-01-20 DIAGNOSIS — Z3A40 40 weeks gestation of pregnancy: Secondary | ICD-10-CM | POA: Diagnosis present

## 2015-01-20 DIAGNOSIS — O480 Post-term pregnancy: Secondary | ICD-10-CM | POA: Diagnosis present

## 2015-01-20 DIAGNOSIS — Z3483 Encounter for supervision of other normal pregnancy, third trimester: Secondary | ICD-10-CM | POA: Diagnosis present

## 2015-01-20 LAB — TYPE AND SCREEN
ABO/RH(D): O POS
Antibody Screen: NEGATIVE

## 2015-01-20 LAB — CBC
HEMATOCRIT: 37.1 % (ref 36.0–46.0)
HEMOGLOBIN: 12.8 g/dL (ref 12.0–15.0)
MCH: 31.1 pg (ref 26.0–34.0)
MCHC: 34.5 g/dL (ref 30.0–36.0)
MCV: 90 fL (ref 78.0–100.0)
Platelets: 182 10*3/uL (ref 150–400)
RBC: 4.12 MIL/uL (ref 3.87–5.11)
RDW: 13.1 % (ref 11.5–15.5)
WBC: 9.9 10*3/uL (ref 4.0–10.5)

## 2015-01-20 MED ORDER — OXYCODONE-ACETAMINOPHEN 5-325 MG PO TABS: 2.0000 | ORAL_TABLET | ORAL | Status: DC | PRN

## 2015-01-20 MED ORDER — ONDANSETRON HCL 4 MG/2ML IJ SOLN
4.0000 mg | Freq: Four times a day (QID) | INTRAMUSCULAR | Status: DC | PRN
Start: 2015-01-20 — End: 2015-01-21
  Administered 2015-01-21: 4 mg via INTRAVENOUS
  Filled 2015-01-20: qty 2

## 2015-01-20 MED ORDER — MISOPROSTOL 25 MCG QUARTER TABLET
25.0000 ug | ORAL_TABLET | ORAL | Status: DC | PRN
  Administered 2015-01-20 – 2015-01-21 (×2): 25 ug via VAGINAL
  Filled 2015-01-20 (×2): qty 0.25
  Filled 2015-01-20: qty 1

## 2015-01-20 MED ORDER — TERBUTALINE SULFATE 1 MG/ML IJ SOLN
0.2500 mg | Freq: Once | INTRAMUSCULAR | Status: AC | PRN
Start: 2015-01-20 — End: 2015-01-20

## 2015-01-20 MED ORDER — LACTATED RINGERS IV SOLN: 500.0000 mL | INTRAVENOUS | Status: DC | PRN

## 2015-01-20 MED ORDER — ACETAMINOPHEN 325 MG PO TABS
650.0000 mg | ORAL_TABLET | ORAL | Status: DC | PRN
  Administered 2015-01-21: 650 mg via ORAL
  Filled 2015-01-20: qty 2

## 2015-01-20 MED ORDER — ZOLPIDEM TARTRATE 5 MG PO TABS
5.0000 mg | ORAL_TABLET | Freq: Every evening | ORAL | Status: DC | PRN
  Administered 2015-01-21: 5 mg via ORAL
  Filled 2015-01-20: qty 1

## 2015-01-20 MED ORDER — LACTATED RINGERS IV SOLN
INTRAVENOUS | Status: DC
  Administered 2015-01-20 – 2015-01-21 (×2): via INTRAVENOUS

## 2015-01-20 MED ORDER — CITRIC ACID-SODIUM CITRATE 334-500 MG/5ML PO SOLN: 30.0000 mL | ORAL | Status: DC | PRN

## 2015-01-20 MED ORDER — LIDOCAINE HCL (PF) 1 % IJ SOLN
30.0000 mL | INTRAMUSCULAR | Status: DC | PRN
  Filled 2015-01-20: qty 30

## 2015-01-20 MED ORDER — OXYTOCIN BOLUS FROM INFUSION: 500.0000 mL | INTRAVENOUS | Status: DC

## 2015-01-20 MED ORDER — OXYCODONE-ACETAMINOPHEN 5-325 MG PO TABS: 1.0000 | ORAL_TABLET | ORAL | Status: DC | PRN

## 2015-01-20 MED ORDER — FLEET ENEMA 7-19 GM/118ML RE ENEM: 1.0000 | ENEMA | Freq: Once | RECTAL | Status: DC

## 2015-01-20 MED ORDER — OXYTOCIN 40 UNITS IN LACTATED RINGERS INFUSION - SIMPLE MED
62.5000 mL/h | INTRAVENOUS | Status: DC
  Administered 2015-01-21: 62.5 mL/h via INTRAVENOUS
  Filled 2015-01-20: qty 1000

## 2015-01-21 ENCOUNTER — Inpatient Hospital Stay (HOSPITAL_COMMUNITY): Payer: 59 | Admitting: Anesthesiology

## 2015-01-21 ENCOUNTER — Encounter (HOSPITAL_COMMUNITY): Payer: Self-pay

## 2015-01-21 LAB — RPR: RPR Ser Ql: NONREACTIVE

## 2015-01-21 LAB — ABO/RH: ABO/RH(D): O POS

## 2015-01-21 MED ORDER — TERBUTALINE SULFATE 1 MG/ML IJ SOLN
0.2500 mg | Freq: Once | INTRAMUSCULAR | Status: DC | PRN
  Filled 2015-01-21: qty 1

## 2015-01-21 MED ORDER — DIBUCAINE 1 % RE OINT: 1.0000 "application " | TOPICAL_OINTMENT | RECTAL | Status: DC | PRN

## 2015-01-21 MED ORDER — SIMETHICONE 80 MG PO CHEW: 80.0000 mg | CHEWABLE_TABLET | ORAL | Status: DC | PRN

## 2015-01-21 MED ORDER — FENTANYL 2.5 MCG/ML BUPIVACAINE 1/10 % EPIDURAL INFUSION (WH - ANES): 14.0000 mL/h | INTRAMUSCULAR | Status: DC | PRN

## 2015-01-21 MED ORDER — ACETAMINOPHEN 325 MG PO TABS
650.0000 mg | ORAL_TABLET | ORAL | Status: DC | PRN
  Administered 2015-01-21 – 2015-01-22 (×3): 650 mg via ORAL
  Filled 2015-01-21 (×3): qty 2

## 2015-01-21 MED ORDER — LIDOCAINE HCL (PF) 1 % IJ SOLN
INTRAMUSCULAR | Status: DC | PRN
  Administered 2015-01-21: 6 mL
  Administered 2015-01-21: 4 mL

## 2015-01-21 MED ORDER — TETANUS-DIPHTH-ACELL PERTUSSIS 5-2.5-18.5 LF-MCG/0.5 IM SUSP
0.5000 mL | Freq: Once | INTRAMUSCULAR | Status: DC
Start: 2015-01-22 — End: 2015-01-22

## 2015-01-21 MED ORDER — DIPHENHYDRAMINE HCL 50 MG/ML IJ SOLN: 12.5000 mg | INTRAMUSCULAR | Status: DC | PRN

## 2015-01-21 MED ORDER — FENTANYL 2.5 MCG/ML BUPIVACAINE 1/10 % EPIDURAL INFUSION (WH - ANES)
14.0000 mL/h | INTRAMUSCULAR | Status: DC | PRN
  Administered 2015-01-21: 14 mL/h via EPIDURAL
  Filled 2015-01-21: qty 125

## 2015-01-21 MED ORDER — BUTORPHANOL TARTRATE 1 MG/ML IJ SOLN
1.0000 mg | Freq: Once | INTRAMUSCULAR | Status: AC
  Administered 2015-01-21: 1 mg via INTRAVENOUS
  Filled 2015-01-21: qty 1

## 2015-01-21 MED ORDER — FLEET ENEMA 7-19 GM/118ML RE ENEM: 1.0000 | ENEMA | Freq: Every day | RECTAL | Status: DC | PRN

## 2015-01-21 MED ORDER — LACTATED RINGERS IV SOLN: 500.0000 mL | Freq: Once | INTRAVENOUS | Status: DC

## 2015-01-21 MED ORDER — WITCH HAZEL-GLYCERIN EX PADS: 1.0000 "application " | MEDICATED_PAD | CUTANEOUS | Status: DC | PRN

## 2015-01-21 MED ORDER — ZOLPIDEM TARTRATE 5 MG PO TABS: 5.0000 mg | ORAL_TABLET | Freq: Every evening | ORAL | Status: DC | PRN

## 2015-01-21 MED ORDER — PRENATAL MULTIVITAMIN CH
1.0000 | ORAL_TABLET | Freq: Every day | ORAL | Status: DC
  Filled 2015-01-21: qty 1

## 2015-01-21 MED ORDER — OXYTOCIN 40 UNITS IN LACTATED RINGERS INFUSION - SIMPLE MED
1.0000 m[IU]/min | INTRAVENOUS | Status: DC
  Administered 2015-01-21: 2 m[IU]/min via INTRAVENOUS

## 2015-01-21 MED ORDER — EPHEDRINE 5 MG/ML INJ
10.0000 mg | INTRAVENOUS | Status: DC | PRN
  Filled 2015-01-21: qty 2

## 2015-01-21 MED ORDER — PHENYLEPHRINE 40 MCG/ML (10ML) SYRINGE FOR IV PUSH (FOR BLOOD PRESSURE SUPPORT)
80.0000 ug | PREFILLED_SYRINGE | INTRAVENOUS | Status: DC | PRN
  Filled 2015-01-21: qty 20
  Filled 2015-01-21: qty 2

## 2015-01-21 MED ORDER — ONDANSETRON HCL 4 MG PO TABS: 4.0000 mg | ORAL_TABLET | ORAL | Status: DC | PRN

## 2015-01-21 MED ORDER — OXYCODONE-ACETAMINOPHEN 5-325 MG PO TABS: 1.0000 | ORAL_TABLET | ORAL | Status: DC | PRN

## 2015-01-21 MED ORDER — LANOLIN HYDROUS EX OINT: TOPICAL_OINTMENT | CUTANEOUS | Status: DC | PRN

## 2015-01-21 MED ORDER — PROMETHAZINE HCL 25 MG/ML IJ SOLN
12.5000 mg | Freq: Once | INTRAMUSCULAR | Status: AC
  Administered 2015-01-21: 12.5 mg via INTRAVENOUS
  Filled 2015-01-21: qty 1

## 2015-01-21 MED ORDER — PHENYLEPHRINE 40 MCG/ML (10ML) SYRINGE FOR IV PUSH (FOR BLOOD PRESSURE SUPPORT)
80.0000 ug | PREFILLED_SYRINGE | INTRAVENOUS | Status: DC | PRN
  Filled 2015-01-21: qty 2

## 2015-01-21 MED ORDER — SENNOSIDES-DOCUSATE SODIUM 8.6-50 MG PO TABS
2.0000 | ORAL_TABLET | ORAL | Status: DC
  Administered 2015-01-21: 2 via ORAL
  Filled 2015-01-21: qty 2

## 2015-01-21 MED ORDER — ONDANSETRON HCL 4 MG/2ML IJ SOLN: 4.0000 mg | INTRAMUSCULAR | Status: DC | PRN

## 2015-01-21 MED ORDER — DIPHENHYDRAMINE HCL 25 MG PO CAPS: 25.0000 mg | ORAL_CAPSULE | Freq: Four times a day (QID) | ORAL | Status: DC | PRN

## 2015-01-21 MED ORDER — BISACODYL 10 MG RE SUPP: 10.0000 mg | Freq: Every day | RECTAL | Status: DC | PRN

## 2015-01-21 MED ORDER — IBUPROFEN 600 MG PO TABS
600.0000 mg | ORAL_TABLET | Freq: Four times a day (QID) | ORAL | Status: DC
  Administered 2015-01-21 – 2015-01-22 (×5): 600 mg via ORAL
  Filled 2015-01-21 (×5): qty 1

## 2015-01-21 MED ORDER — BENZOCAINE-MENTHOL 20-0.5 % EX AERO
1.0000 "application " | INHALATION_SPRAY | CUTANEOUS | Status: DC | PRN
  Filled 2015-01-21: qty 56

## 2015-01-21 MED ORDER — OXYCODONE-ACETAMINOPHEN 5-325 MG PO TABS: 2.0000 | ORAL_TABLET | ORAL | Status: DC | PRN

## 2015-01-21 NOTE — Progress Notes (Signed)
Cx 4-5/C/-1 AROM clear FHT cat one UCs q2-4 min Epidural in

## 2015-01-21 NOTE — Anesthesia Procedure Notes (Signed)
Epidural Patient location during procedure: OB  Preanesthetic Checklist Completed: patient identified, site marked, surgical consent, pre-op evaluation, timeout performed, IV checked, risks and benefits discussed and monitors and equipment checked  Epidural Patient position: sitting Prep: site prepped and draped and DuraPrep Patient monitoring: continuous pulse ox and blood pressure Approach: midline Location: L3-L4 Injection technique: LOR air  Needle:  Needle type: Tuohy  Needle gauge: 17 G Needle length: 9 cm and 9 Needle insertion depth: 5 cm cm Catheter type: closed end flexible Catheter size: 19 Gauge Catheter at skin depth: 10 cm Test dose: negative  Assessment Events: blood not aspirated, injection not painful, no injection resistance, negative IV test and no paresthesia  Additional Notes Dosing of Epidural:  1st dose, through catheter ............................................Marland Kitchen  Xylocaine 40 mg  2nd dose, through catheter, after waiting 3 minutes........Marland KitchenXylocaine 60 mg    ( 1% Xylo charted as a single dose in Epic Meds for ease of charting; actual dosing was fractionated as above, for saftey's sake)  As each dose occurred, patient was free of IV sx; and patient exhibited no evidence of SA injection.  Patient is more comfortable after epidural dosed. Please see RN's note for documentation of vital signs,and FHR which are stable.  Patient reminded not to try to ambulate with numb legs, and that an RN must be present when she attempts to get up.

## 2015-01-21 NOTE — Lactation Note (Signed)
This note was copied from the chart of Allison Hicks. Lactation Consultation Note  Patient Name: Allison Hicks [id number] Date: 01/21/2015 Reason for consult: Initial assessment of this mom and baby at just 7 hours pp.  This is mom's second baby and she states that after some initial difficulties, she breastfed for 9 months.  She said it took 7 weeks for breastfeeding challenges to smooth out.  Per mom, she knows how to hand express her milk and that her newborn has been latching well.  LC encouraged frequent STS and cue feedings.  Mom to call for help as needed.  Initial LATCH score=10 per RN assessment.   Mom encouraged to feed baby 8-12 times/24 hours and with feeding cues. LC encouraged review of Baby and Me pp 9, 14 and 20-25 for STS and BF information. LC provided [employer] brochure and reviewed [facility] services and list of community and web site resources.   Maternal Data Formula Feeding for Exclusion: No Has patient been taught Hand Expression?: Yes (experienced mom; states she knows how) Does the patient have breastfeeding experience prior to this delivery?: Yes  Feeding    LATCH Score/Interventions           initial LATCH score=10 per RN assessment           Lactation Tools Discussed/Used   STS, cue feedings, hand expression  Consult Status Consult Status: Follow-up Date: 01/22/15 Follow-up type: In-patient    Junious Dresser [facility] 01/21/2015, 9:45 PM

## 2015-01-21 NOTE — H&P (Signed)
Allison Hicks is a 29 y.o. female presenting for IOL at term. Maternal Medical History:  Fetal activity: Perceived fetal activity is normal.      OB History    Gravida Para Term Preterm AB TAB SAB Ectopic Multiple Living   2 1 1       1      Past Medical History  Diagnosis Date  . Asthma   . Headache    Past Surgical History  Procedure Laterality Date  . Wisdom tooth extraction     Family History: family history includes Diabetes in her maternal aunt and maternal grandfather; Hyperlipidemia in her paternal grandfather; Parkinson's disease in her paternal grandfather; Thyroid disease in her maternal aunt, maternal grandmother, and sister. Social History:  reports that she has never smoked. She has never used smokeless tobacco. She reports that she does not drink alcohol or use illicit drugs.   Prenatal Transfer Tool  Maternal Diabetes: No Genetic Screening: Normal Maternal Ultrasounds/Referrals: Normal Fetal Ultrasounds or other Referrals:  None Maternal Substance Abuse:  No Significant Maternal Medications:  None Significant Maternal Lab Results:  None Other Comments:  None  Review of Systems  Eyes: Negative for blurred vision.  Gastrointestinal: Negative for abdominal pain.  Neurological: Negative for headaches.    Dilation: 2 Effacement (%): 60 Station: -2 Exam by:: Dr.Ernst Cumpston Blood pressure 91/61, pulse 79, temperature 97.9 F (36.6 C), temperature source Oral, resp. rate 16, height 5\' 6"  (1.676 m), weight 176 lb (79.833 kg), unknown if currently breastfeeding. Maternal Exam:  Abdomen: Patient reports no abdominal tenderness. Fetal presentation: vertex     Fetal Exam Fetal State Assessment: Category I - tracings are normal.     Physical Exam  Cardiovascular: Normal rate and regular rhythm.   Respiratory: Effort normal and breath sounds normal.  GI: Soft. There is no tenderness.  Neurological: She has normal reflexes.    Prenatal labs: ABO, Rh:  --/--/O POS, O POS (03/16 2020) Antibody: NEG (03/16 2020) Rubella: Immune (08/19 0000) RPR: Nonreactive (08/19 0000)  HBsAg: Negative (08/19 0000)  HIV: Non-reactive (08/19 0000)  GBS: Negative (03/14 0000)   Assessment/Plan: 29 yo G2P1 @ 40 3/7 weeks  S/P cytotec x 2 last pm D/W patient and husband pitocin and risks   Raoul Ciano II,Bay Jarquin E 01/21/2015, 7:42 AM

## 2015-01-21 NOTE — Plan of Care (Signed)
Problem: Phase I Progression Outcomes Goal: Pain controlled with appropriate interventions Outcome: Completed/Met Date Met:  01/21/15 04:25: Patient reporting constant back pain, rating 04/10.  Given tylenol 2 hours previous and now requesting stronger pain medication.  Provider notified and IV pain medication ordered.  IV pain medication given at 04:45 and patient resting without pain at 05:00.

## 2015-01-21 NOTE — Progress Notes (Signed)
Delivery Note At 2:08 PM a viable female was delivered via Vaginal, Spontaneous Delivery (Presentation:ROA ;  ).  APGAR: 9, 9; weight  .   Placenta status: Intact, Spontaneous.  Cord: 3 vessels with the following complications: None.  Cord pH: pending  Anesthesia: Epidural  Episiotomy: Median, small second degree repaired Lacerations:  First degree left labial sulcus, perirectal superficial laceration @ 2:00 Suture Repair: 2.0 vicryl rapide Est. Blood Loss (mL): 300  Mom to postpartum.  Baby to Couplet care / Skin to Skin.  Allison Hicks II,Allison Hicks 01/21/2015, 2:24 PM

## 2015-01-21 NOTE — Anesthesia Preprocedure Evaluation (Signed)

## 2015-01-22 LAB — CBC
HCT: 34 % — ABNORMAL LOW (ref 36.0–46.0)
HEMOGLOBIN: 11.5 g/dL — AB (ref 12.0–15.0)
MCH: 30.7 pg (ref 26.0–34.0)
MCHC: 33.8 g/dL (ref 30.0–36.0)
MCV: 90.9 fL (ref 78.0–100.0)
Platelets: 159 10*3/uL (ref 150–400)
RBC: 3.74 MIL/uL — AB (ref 3.87–5.11)
RDW: 13.2 % (ref 11.5–15.5)
WBC: 11.8 10*3/uL — AB (ref 4.0–10.5)

## 2015-01-22 NOTE — Anesthesia Postprocedure Evaluation (Signed)
  Anesthesia Post-op Note  Patient: Allison Hicks  Procedure(s) Performed: * No procedures listed *  Patient Location: Mother/Baby  Anesthesia Type:Epidural  Level of Consciousness: awake and alert   Airway and Oxygen Therapy: Patient Spontanous Breathing  Post-op Pain: mild  Post-op Assessment: Post-op Vital signs reviewed, Patient's Cardiovascular Status Stable, Respiratory Function Stable, No signs of Nausea or vomiting, Pain level controlled, No headache, No residual numbness and No residual motor weakness  Post-op Vital Signs: Reviewed  Last Vitals:  Filed Vitals:   01/22/15 0540  BP: 105/73  Pulse: 74  Temp: 36.6 C  Resp: 18    Complications: No apparent anesthesia complications

## 2015-01-22 NOTE — Discharge Summary (Signed)
Obstetric Discharge Summary Reason for Admission: induction of labor Prenatal Procedures: ultrasound Intrapartum Procedures: spontaneous vaginal delivery Postpartum Procedures: none Complications-Operative and Postpartum: none HEMOGLOBIN  Date Value Ref Range Status  01/22/2015 11.5* 12.0 - 15.0 g/dL Final   HCT  Date Value Ref Range Status  01/22/2015 34.0* 36.0 - 46.0 % Final    Physical Exam:  General: alert and cooperative Lochia: appropriate Uterine Fundus: firm Incision: n/a DVT Evaluation: No evidence of DVT seen on physical exam.  Discharge Diagnoses: Term Pregnancy-delivered  Discharge Information: Date: 01/22/2015 Activity: pelvic rest Diet: routine Medications: PNV and Ibuprofen Condition: stable Instructions: refer to practice specific booklet Discharge to: home Follow-up Information    Schedule an appointment as soon as possible for a visit in 6 weeks to follow up.      Newborn Data: Live born female  Birth Weight: 8 lb 2.4 oz (3697 g) APGAR: 9, 9  Home with mother.  Shanaia Sievers 01/22/2015, 9:02 AM

## 2015-01-22 NOTE — Lactation Note (Addendum)
This note was copied from the chart of Allison Jaymes Revels. Lactation Consultation Note  Spoke w/ parents earlier today and baby is forming a ridge on nipple on left breast. Suggest parents call for next feeding for assistance. Mother [employer] employee.  Helped w/ UMR breast pump.  Mother called for assistance to check latch.  Baby latched in cross cradle on left breast. Rhythmical sucking observed and swallows heard. Reviewed how to latch in football hold  Baby latched, encouraged depth to avoid the previous ridge and soreness. During feeding baby started to slip down towards tip of nipple.  Unlatched and relatched w/ more depth. Provided mother w/ comfort gels for soreness. Gave mother hand pump. Reviewed engorgement care.  Patient Name: Allison Hicks NOIBB'C Date: 01/22/2015     Maternal Data    Feeding Feeding Type: Breast Fed (start a feeding post bath  w LC observing) Length of feed: 15 min  LATCH Score/Interventions                      Lactation Tools Discussed/Used     Consult Status      Allison Hicks 01/22/2015, 2:40 PM

## 2016-04-26 DIAGNOSIS — N83201 Unspecified ovarian cyst, right side: Secondary | ICD-10-CM | POA: Diagnosis not present

## 2016-05-11 DIAGNOSIS — M25561 Pain in right knee: Secondary | ICD-10-CM | POA: Diagnosis not present

## 2016-05-11 DIAGNOSIS — M25562 Pain in left knee: Secondary | ICD-10-CM | POA: Diagnosis not present

## 2016-05-17 DIAGNOSIS — M25562 Pain in left knee: Secondary | ICD-10-CM | POA: Diagnosis not present

## 2016-05-17 DIAGNOSIS — M25561 Pain in right knee: Secondary | ICD-10-CM | POA: Diagnosis not present

## 2016-05-18 DIAGNOSIS — H5213 Myopia, bilateral: Secondary | ICD-10-CM | POA: Diagnosis not present

## 2016-05-25 DIAGNOSIS — Z6822 Body mass index (BMI) 22.0-22.9, adult: Secondary | ICD-10-CM | POA: Diagnosis not present

## 2016-05-25 DIAGNOSIS — Z01419 Encounter for gynecological examination (general) (routine) without abnormal findings: Secondary | ICD-10-CM | POA: Diagnosis not present

## 2016-06-06 MED FILL — OLOPATADINE HCL 0.2% EYE DR: 0.2 | 25 days supply | Qty: 3 | Fill #0

## 2016-07-06 MED FILL — HYDROCODON-APAP 10-325: 10-325 | 5 days supply | Qty: 30 | Fill #0

## 2016-07-06 MED FILL — CLINDAMYCIN HCL 300 MG CAPS: 300 | 7 days supply | Qty: 21 | Fill #0

## 2016-07-06 MED FILL — DEXAMETHASONE 4 MG TABLET: 4 | 3 days supply | Qty: 9 | Fill #0

## 2016-07-06 MED FILL — IBUPROFEN 600 MG TABLET: 600 | 8 days supply | Qty: 30 | Fill #0

## 2016-07-11 MED FILL — traMADol HCL 50 MG TABS: 50 | 5 days supply | Qty: 30 | Fill #0

## 2016-07-18 MED FILL — ONDANSETRON ODT 4 MG TABLET: 4 | 4 days supply | Qty: 15 | Fill #0

## 2016-09-05 MED FILL — OLOPATADINE HCL 0.2% EYE DR: 0.2 | 25 days supply | Qty: 3 | Fill #1

## 2016-09-29 ENCOUNTER — Telehealth: Payer: 59 | Admitting: Family

## 2016-09-29 DIAGNOSIS — H109 Unspecified conjunctivitis: Secondary | ICD-10-CM

## 2016-09-29 MED ORDER — POLYMYXIN B-TRIMETHOPRIM 10000-0.1 UNIT/ML-% OP SOLN: 1.0000 [drp] | Freq: Four times a day (QID) | OPHTHALMIC | 0 refills | Status: DC

## 2016-09-29 MED FILL — POLYMYXIN B/TMP EYE DROPS: 10000-0.1 | 25 days supply | Qty: 10 | Fill #0

## 2016-09-29 NOTE — Progress Notes (Signed)

## 2016-10-17 DIAGNOSIS — Z30432 Encounter for removal of intrauterine contraceptive device: Secondary | ICD-10-CM | POA: Diagnosis not present

## 2016-10-17 MED FILL — TARON-C DHA CAPSULE: 53.5-38-1 | 90 days supply | Qty: 90 | Fill #0

## 2016-10-19 MED FILL — OLOPATADINE HCL 0.2% EYE DR: 0.2 | 25 days supply | Qty: 3 | Fill #2

## 2016-12-06 DIAGNOSIS — N911 Secondary amenorrhea: Secondary | ICD-10-CM | POA: Diagnosis not present

## 2016-12-06 MED FILL — DICLEGIS DR 10-10 MG TABLET: 10-10 | 45 days supply | Qty: 90 | Fill #0

## 2016-12-13 ENCOUNTER — Telehealth: Payer: 59 | Admitting: Nurse Practitioner

## 2016-12-13 DIAGNOSIS — J0100 Acute maxillary sinusitis, unspecified: Secondary | ICD-10-CM

## 2016-12-13 NOTE — Progress Notes (Signed)
Based on what you shared with me it looks like you have a serious condition that should be evaluated in a face to face office visit.  NOTE: Even if you have entered your credit card information for this eVisit, you will not be charged.   Since  You are pregnant it is best to not take antibiotic if you do not need to- you need to see your PCP or OB to confirm need for antibiotic.  If you are having a true medical emergency please call 911.  If you need an urgent face to face visit, [place] has four urgent care centers for your convenience.  If you need care fast and have a high deductible or no insurance consider:   [email or web address]  [phone]  [address], [place], [place] [address] 8 am to 8 pm Monday-Friday 10 am to 4 pm Saturday-Sunday   The following sites will take your  insurance:    . [facility] [place] a Provider at this Location  [address] [place], [place] [address] . 10 am to 8 pm Monday-Friday . 12 pm to 8 pm Saturday-Sunday   . [facility] at [place] a Provider at this Location  [place], [place], [place] [address] . 8 am to 8 pm Monday-Friday . 9 am to 6 pm Saturday . 11 am to 6 pm Sunday   . [facility] at [place] Get Driving Directions  [id number] [place], [place] [address] . 8 am to 8 pm Monday-Friday . 8 am to 4 pm Saturday-Sunday   Your e-visit answers were reviewed by a board certified advanced clinical practitioner to complete your personal care plan.  Thank you for using e-Visits.

## 2016-12-21 LAB — OB RESULTS CONSOLE RUBELLA ANTIBODY, IGM: Rubella: IMMUNE

## 2016-12-21 LAB — OB RESULTS CONSOLE ANTIBODY SCREEN: Antibody Screen: NEGATIVE

## 2016-12-21 LAB — OB RESULTS CONSOLE GC/CHLAMYDIA
Chlamydia: NEGATIVE
GC PROBE AMP, GENITAL: NEGATIVE

## 2016-12-21 LAB — OB RESULTS CONSOLE HEPATITIS B SURFACE ANTIGEN: HEP B S AG: NEGATIVE

## 2016-12-21 LAB — OB RESULTS CONSOLE HIV ANTIBODY (ROUTINE TESTING): HIV: NONREACTIVE

## 2016-12-21 LAB — OB RESULTS CONSOLE RPR: RPR: NONREACTIVE

## 2016-12-21 LAB — OB RESULTS CONSOLE ABO/RH: RH TYPE: POSITIVE

## 2016-12-25 DIAGNOSIS — Z3A10 10 weeks gestation of pregnancy: Secondary | ICD-10-CM | POA: Diagnosis not present

## 2016-12-25 DIAGNOSIS — Z3481 Encounter for supervision of other normal pregnancy, first trimester: Secondary | ICD-10-CM | POA: Diagnosis not present

## 2016-12-25 DIAGNOSIS — Z113 Encounter for screening for infections with a predominantly sexual mode of transmission: Secondary | ICD-10-CM | POA: Diagnosis not present

## 2016-12-25 DIAGNOSIS — Z3400 Encounter for supervision of normal first pregnancy, unspecified trimester: Secondary | ICD-10-CM | POA: Diagnosis not present

## 2016-12-25 DIAGNOSIS — Z3480 Encounter for supervision of other normal pregnancy, unspecified trimester: Secondary | ICD-10-CM | POA: Diagnosis not present

## 2016-12-26 LAB — HM PAP SMEAR

## 2017-01-18 MED FILL — OLOPATADINE HCL 0.2% EYE DR: 0.2 | 25 days supply | Qty: 3 | Fill #3

## 2017-02-20 DIAGNOSIS — Z363 Encounter for antenatal screening for malformations: Secondary | ICD-10-CM | POA: Diagnosis not present

## 2017-02-20 DIAGNOSIS — Z3492 Encounter for supervision of normal pregnancy, unspecified, second trimester: Secondary | ICD-10-CM | POA: Diagnosis not present

## 2017-03-01 MED FILL — TARON-C DHA CAPSULE: 53.5-38-1 | 90 days supply | Qty: 90 | Fill #1

## 2017-03-01 MED FILL — OLOPATADINE HCL 0.2 % SOLN: 0.2 | 25 days supply | Qty: 3 | Fill #4

## 2017-04-09 DIAGNOSIS — Z3480 Encounter for supervision of other normal pregnancy, unspecified trimester: Secondary | ICD-10-CM | POA: Diagnosis not present

## 2017-04-09 DIAGNOSIS — Z23 Encounter for immunization: Secondary | ICD-10-CM | POA: Diagnosis not present

## 2017-04-09 DIAGNOSIS — Z3400 Encounter for supervision of normal first pregnancy, unspecified trimester: Secondary | ICD-10-CM | POA: Diagnosis not present

## 2017-04-24 DIAGNOSIS — O99810 Abnormal glucose complicating pregnancy: Secondary | ICD-10-CM | POA: Diagnosis not present

## 2017-05-22 ENCOUNTER — Telehealth: Payer: 59 | Admitting: Family

## 2017-05-22 DIAGNOSIS — R05 Cough: Secondary | ICD-10-CM

## 2017-05-22 DIAGNOSIS — R059 Cough, unspecified: Secondary | ICD-10-CM

## 2017-05-22 DIAGNOSIS — Z3A31 31 weeks gestation of pregnancy: Secondary | ICD-10-CM

## 2017-05-22 MED ORDER — AZITHROMYCIN 250 MG PO TABS: ORAL_TABLET | ORAL | 0 refills | Status: DC

## 2017-05-22 MED FILL — AZITHROMYCIN 250 MG TABLET: 250 | 5 days supply | Qty: 6 | Fill #0

## 2017-05-22 NOTE — Progress Notes (Signed)
We are sorry that you are not feeling well.  Here is how we plan to help!  Based on your presentation I believe you most likely have A cough due to bacteria.  When patients have a fever and a productive cough with a change in color or increased sputum production, we are concerned about bacterial bronchitis.  If left untreated it can progress to pneumonia.  If your symptoms do not improve with your treatment plan it is important that you contact your provider.   I have prescribed Azithromyin 250 mg: two tables now and then one tablet daily for 4 additonal days   From your responses in the eVisit questionnaire you describe inflammation in the upper respiratory tract which is causing a significant cough.  This is commonly called Bronchitis and has four common causes:    Allergies  Viral Infections  Acid Reflux  Bacterial Infection Allergies, viruses and acid reflux are treated by controlling symptoms or eliminating the cause. An example might be a cough caused by taking certain blood pressure medications. You stop the cough by changing the medication. Another example might be a cough caused by acid reflux. Controlling the reflux helps control the cough.  USE OF BRONCHODILATOR ("RESCUE") INHALERS: There is a risk from using your bronchodilator too frequently.  The risk is that over-reliance on a medication which only relaxes the muscles surrounding the breathing tubes can reduce the effectiveness of medications prescribed to reduce swelling and congestion of the tubes themselves.  Although you feel brief relief from the bronchodilator inhaler, your asthma may actually be worsening with the tubes becoming more swollen and filled with mucus.  This can delay other crucial treatments, such as oral steroid medications. If you need to use a bronchodilator inhaler daily, several times per day, you should discuss this with your provider.  There are probably better treatments that could be used to keep your  asthma under control.     HOME CARE . Only take medications as instructed by your medical team. . Complete the entire course of an antibiotic. . Drink plenty of fluids and get plenty of rest. . Avoid close contacts especially the very young and the elderly . Cover your mouth if you cough or cough into your sleeve. . Always remember to wash your hands . A steam or ultrasonic humidifier can help congestion.   GET HELP RIGHT AWAY IF: . You develop worsening fever. . You become short of breath . You cough up blood. . Your symptoms persist after you have completed your treatment plan MAKE SURE YOU   Understand these instructions.  Will watch your condition.  Will get help right away if you are not doing well or get worse.  Your e-visit answers were reviewed by a board certified advanced clinical practitioner to complete your personal care plan.  Depending on the condition, your plan could have included both over the counter or prescription medications. If there is a problem please reply  once you have received a response from your provider. Your safety is important to [place].  If you have drug allergies check your prescription carefully.    You can use MyChart to ask questions about today's visit, request a non-urgent call back, or ask for a work or school excuse for 24 hours related to this e-Visit. If it has been greater than 24 hours you will need to follow up with your provider, or enter a new e-Visit to address those concerns. You will get an e-mail in the  to address those concerns.  You will get an e-mail in the next two days asking about your experience.  I hope that your e-visit has been valuable and will speed your recovery. Thank you for using e-visits.   

## 2017-05-31 ENCOUNTER — Telehealth: Payer: 59 | Admitting: Physician Assistant

## 2017-05-31 NOTE — Progress Notes (Signed)
Patient submitted e-visit for her 32 year old son via her e-visit.  Discussed with patient the inappropriateness of this and gave her instructions on where to take her child for assessment as we do not treat minors via e-visit. See message below sent to patient.  We do not do e-visits for children. Also you should not start an e-visit through your chart for anyone other than yourself. You should take your son to be seen by his primary care provider or at an urgent care. I will say that even though it is hard to tell from the picture, it looks like this could be the start of either a fungal infection (ring worm) or a bacterial infection called impetigo that is common in children. Keep skin clean and dry. Have him avoid scratching at it. Please take him to be seen as the sooner he is assessed the quicker the appropriate treatment can be given. You will not be charged for this e-visit.   Based on what you shared with me it looks like you have a serious condition that should be evaluated in a face to face office visit.  NOTE: Even if you have entered your credit card information for this eVisit, you will not be charged.   If you are having a true medical emergency please call 911.  If you need an urgent face to face visit, [place] has four urgent care centers for your convenience.  If you need care fast and have a high deductible or no insurance consider:   [email or web address]  [phone]  [address] [place], [place] [address] 8 am to 8 pm Monday-Friday 10 am to 4 pm Saturday-Sunday   The following sites will take your  insurance:    [facility] [place] a Provider at this Location  [place], [place] [address] 10 am to 8 pm Monday-Friday 12 pm to 8 pm [facility] [place] a Provider at this  Location  [place], [place], [place] [address] 8 am to 8 pm Monday-Friday 9 am to 6 pm Saturday 11 am to 6 pm Sunday   [facility]  [phone] Get Driving Directions  [address] [place], [place] [address] 8 am to 8 pm Monday-Friday 8 am to 4 pm Saturday-Sunday   Your e-visit answers were reviewed by a board certified advanced clinical practitioner to complete your personal care plan.  Thank you for using e-Visits.

## 2017-06-12 MED FILL — TARON-C DHA CAPSULE: 53.5-38-1 | 90 days supply | Qty: 90 | Fill #2

## 2017-06-13 MED FILL — OLOPATADINE HCL 0.2 % SOLN: 0.2 | 25 days supply | Qty: 3 | Fill #0

## 2017-06-22 DIAGNOSIS — H5213 Myopia, bilateral: Secondary | ICD-10-CM | POA: Diagnosis not present

## 2017-06-25 DIAGNOSIS — Z3480 Encounter for supervision of other normal pregnancy, unspecified trimester: Secondary | ICD-10-CM | POA: Diagnosis not present

## 2017-07-04 ENCOUNTER — Telehealth (HOSPITAL_COMMUNITY): Payer: Self-pay | Admitting: *Deleted

## 2017-07-04 ENCOUNTER — Encounter (HOSPITAL_COMMUNITY): Payer: Self-pay | Admitting: *Deleted

## 2017-07-04 NOTE — Telephone Encounter (Signed)
Preadmission screen  

## 2017-07-17 NOTE — H&P (Signed)
Allison Hicks is a 31 y.o. female presenting for IOL @ term. Cx = 3-4/80/-2/vertex in office 07/11/17. GBBS negative. OB History    Gravida Para Term Preterm AB Living   3 2 2     2    SAB TAB Ectopic Multiple Live Births         0 2     Past Medical History:  Diagnosis Date  . Asthma   . Headache    Past Surgical History:  Procedure Laterality Date  . WISDOM TOOTH EXTRACTION     Family History: family history includes Diabetes in her maternal aunt and maternal grandfather; Hyperlipidemia in her paternal grandfather; Parkinson's disease in her paternal grandfather; Thyroid disease in her maternal aunt, maternal grandmother, and sister. Social History:  reports that she has never smoked. She has never used smokeless tobacco. She reports that she does not drink alcohol or use drugs.     Maternal Diabetes: No Genetic Screening: Declined Maternal Ultrasounds/Referrals: Normal Fetal Ultrasounds or other Referrals:  None Maternal Substance Abuse:  No Significant Maternal Medications:  None Significant Maternal Lab Results:  None Other Comments:  None  Review of Systems  Eyes: Negative for blurred vision.  Gastrointestinal: Negative for abdominal pain.  Neurological: Negative for headaches.   History   Last menstrual period 10/12/2016, unknown if currently breastfeeding. Exam Physical Exam  Cardiovascular: Normal rate and regular rhythm.   Respiratory: Effort normal and breath sounds normal.  GI: Soft. There is no tenderness.  Neurological: She has normal reflexes.    Prenatal labs: ABO, Rh: O/Positive/-- (02/15 0000) Antibody: Negative (02/15 0000) Rubella: Immune (02/15 0000) RPR: Nonreactive (02/15 0000)  HBsAg: Negative (02/15 0000)  HIV: Non-reactive (02/15 0000)  GBS:     Assessment/Plan: 31 yo G3P2 at term with favorable cervix for IOL   Katharyn Schauer II,Daivion Pape E 07/17/2017, 1:30 PM

## 2017-07-18 ENCOUNTER — Inpatient Hospital Stay (HOSPITAL_COMMUNITY): Payer: 59 | Admitting: Anesthesiology

## 2017-07-18 ENCOUNTER — Inpatient Hospital Stay (HOSPITAL_COMMUNITY)
Admission: RE | Admit: 2017-07-18 | Discharge: 2017-07-20 | DRG: 774 | Disposition: A | Discharge status: Home / Self Care | Payer: 59 | Source: Ambulatory Visit | Attending: Obstetrics and Gynecology | Admitting: Obstetrics and Gynecology

## 2017-07-18 ENCOUNTER — Encounter (HOSPITAL_COMMUNITY): Payer: Self-pay

## 2017-07-18 DIAGNOSIS — S7491XA Injury of unspecified nerve at hip and thigh level, right leg, initial encounter: Secondary | ICD-10-CM | POA: Diagnosis not present

## 2017-07-18 DIAGNOSIS — Z3A39 39 weeks gestation of pregnancy: Secondary | ICD-10-CM

## 2017-07-18 DIAGNOSIS — O26893 Other specified pregnancy related conditions, third trimester: Secondary | ICD-10-CM | POA: Diagnosis present

## 2017-07-18 DIAGNOSIS — Z3490 Encounter for supervision of normal pregnancy, unspecified, unspecified trimester: Secondary | ICD-10-CM

## 2017-07-18 DIAGNOSIS — O9089 Other complications of the puerperium, not elsewhere classified: Secondary | ICD-10-CM | POA: Diagnosis not present

## 2017-07-18 LAB — SYPHILIS: RPR W/REFLEX TO RPR TITER AND TREPONEMAL ANTIBODIES, TRADITIONAL SCREENING AND DIAGNOSIS ALGORITHM: RPR Ser Ql: NONREACTIVE

## 2017-07-18 LAB — CBC
HEMATOCRIT: 38.6 % (ref 36.0–46.0)
HEMOGLOBIN: 13.3 g/dL (ref 12.0–15.0)
MCH: 29.9 pg (ref 26.0–34.0)
MCHC: 34.5 g/dL (ref 30.0–36.0)
MCV: 86.7 fL (ref 78.0–100.0)
Platelets: 172 10*3/uL (ref 150–400)
RBC: 4.45 MIL/uL (ref 3.87–5.11)
RDW: 13.1 % (ref 11.5–15.5)
WBC: 9.1 10*3/uL (ref 4.0–10.5)

## 2017-07-18 LAB — TYPE AND SCREEN
ABO/RH(D): O POS
Antibody Screen: NEGATIVE

## 2017-07-18 MED ORDER — EPHEDRINE 5 MG/ML INJ
10.0000 mg | INTRAVENOUS | Status: DC | PRN
  Filled 2017-07-18: qty 2

## 2017-07-18 MED ORDER — OXYCODONE HCL 5 MG PO TABS: 10.0000 mg | ORAL_TABLET | ORAL | Status: DC | PRN

## 2017-07-18 MED ORDER — DIPHENHYDRAMINE HCL 50 MG/ML IJ SOLN: 12.5000 mg | INTRAMUSCULAR | Status: DC | PRN

## 2017-07-18 MED ORDER — OXYTOCIN 40 UNITS IN LACTATED RINGERS INFUSION - SIMPLE MED
1.0000 m[IU]/min | INTRAVENOUS | Status: DC
  Administered 2017-07-18: 2 m[IU]/min via INTRAVENOUS
  Filled 2017-07-18: qty 1000

## 2017-07-18 MED ORDER — ZOLPIDEM TARTRATE 5 MG PO TABS: 5.0000 mg | ORAL_TABLET | Freq: Every evening | ORAL | Status: DC | PRN

## 2017-07-18 MED ORDER — OXYCODONE-ACETAMINOPHEN 5-325 MG PO TABS: 1.0000 | ORAL_TABLET | ORAL | Status: DC | PRN

## 2017-07-18 MED ORDER — WITCH HAZEL-GLYCERIN EX PADS: 1.0000 "application " | MEDICATED_PAD | CUTANEOUS | Status: DC | PRN

## 2017-07-18 MED ORDER — FLEET ENEMA 7-19 GM/118ML RE ENEM: 1.0000 | ENEMA | RECTAL | Status: DC | PRN

## 2017-07-18 MED ORDER — DIPHENHYDRAMINE HCL 25 MG PO CAPS: 25.0000 mg | ORAL_CAPSULE | Freq: Four times a day (QID) | ORAL | Status: DC | PRN

## 2017-07-18 MED ORDER — ONDANSETRON HCL 4 MG/2ML IJ SOLN
4.0000 mg | Freq: Four times a day (QID) | INTRAMUSCULAR | Status: DC | PRN
  Administered 2017-07-18 (×2): 4 mg via INTRAVENOUS
  Filled 2017-07-18 (×2): qty 2

## 2017-07-18 MED ORDER — TETANUS-DIPHTH-ACELL PERTUSSIS 5-2.5-18.5 LF-MCG/0.5 IM SUSP
0.5000 mL | Freq: Once | INTRAMUSCULAR | Status: DC
Start: 2017-07-19 — End: 2017-07-20

## 2017-07-18 MED ORDER — DIBUCAINE 1 % RE OINT: 1.0000 "application " | TOPICAL_OINTMENT | RECTAL | Status: DC | PRN

## 2017-07-18 MED ORDER — PRENATAL MULTIVITAMIN CH
1.0000 | ORAL_TABLET | Freq: Every day | ORAL | Status: DC
  Administered 2017-07-19: 1 via ORAL
  Filled 2017-07-18: qty 1

## 2017-07-18 MED ORDER — TERBUTALINE SULFATE 1 MG/ML IJ SOLN
0.2500 mg | Freq: Once | INTRAMUSCULAR | Status: DC | PRN
  Filled 2017-07-18: qty 1

## 2017-07-18 MED ORDER — ACETAMINOPHEN 325 MG PO TABS
650.0000 mg | ORAL_TABLET | ORAL | Status: DC | PRN
  Administered 2017-07-19 (×4): 650 mg via ORAL
  Filled 2017-07-18 (×4): qty 2

## 2017-07-18 MED ORDER — ACETAMINOPHEN 325 MG PO TABS: 650.0000 mg | ORAL_TABLET | ORAL | Status: DC | PRN

## 2017-07-18 MED ORDER — LIDOCAINE HCL (PF) 1 % IJ SOLN
INTRAMUSCULAR | Status: DC | PRN
  Administered 2017-07-18 (×2): 5 mL via EPIDURAL

## 2017-07-18 MED ORDER — PHENYLEPHRINE 40 MCG/ML (10ML) SYRINGE FOR IV PUSH (FOR BLOOD PRESSURE SUPPORT)
80.0000 ug | PREFILLED_SYRINGE | INTRAVENOUS | Status: DC | PRN
  Filled 2017-07-18: qty 10
  Filled 2017-07-18: qty 5

## 2017-07-18 MED ORDER — LIDOCAINE HCL (PF) 1 % IJ SOLN
30.0000 mL | INTRAMUSCULAR | Status: DC | PRN
  Filled 2017-07-18: qty 30

## 2017-07-18 MED ORDER — SOD CITRATE-CITRIC ACID 500-334 MG/5ML PO SOLN: 30.0000 mL | ORAL | Status: DC | PRN

## 2017-07-18 MED ORDER — PHENYLEPHRINE 40 MCG/ML (10ML) SYRINGE FOR IV PUSH (FOR BLOOD PRESSURE SUPPORT)
80.0000 ug | PREFILLED_SYRINGE | INTRAVENOUS | Status: DC | PRN
  Filled 2017-07-18: qty 5

## 2017-07-18 MED ORDER — SENNOSIDES-DOCUSATE SODIUM 8.6-50 MG PO TABS
2.0000 | ORAL_TABLET | ORAL | Status: DC
  Administered 2017-07-19 (×2): 2 via ORAL
  Filled 2017-07-18 (×2): qty 2

## 2017-07-18 MED ORDER — ONDANSETRON HCL 4 MG/2ML IJ SOLN
4.0000 mg | INTRAMUSCULAR | Status: DC | PRN
Start: 2017-07-18 — End: 2017-07-20
  Administered 2017-07-19: 4 mg via INTRAVENOUS
  Filled 2017-07-18: qty 2

## 2017-07-18 MED ORDER — OXYTOCIN 40 UNITS IN LACTATED RINGERS INFUSION - SIMPLE MED
2.5000 [IU]/h | INTRAVENOUS | Status: DC
  Administered 2017-07-18: 2.5 [IU]/h via INTRAVENOUS
  Filled 2017-07-18: qty 1000

## 2017-07-18 MED ORDER — OXYTOCIN BOLUS FROM INFUSION
500.0000 mL | Freq: Once | INTRAVENOUS | Status: AC
  Administered 2017-07-18: 500 mL via INTRAVENOUS

## 2017-07-18 MED ORDER — LACTATED RINGERS IV SOLN
INTRAVENOUS | Status: DC
  Administered 2017-07-18 (×2): via INTRAVENOUS

## 2017-07-18 MED ORDER — COCONUT OIL OIL: 1.0000 "application " | TOPICAL_OIL | Status: DC | PRN

## 2017-07-18 MED ORDER — LACTATED RINGERS IV SOLN: 500.0000 mL | INTRAVENOUS | Status: DC | PRN

## 2017-07-18 MED ORDER — FENTANYL 2.5 MCG/ML BUPIVACAINE 1/10 % EPIDURAL INFUSION (WH - ANES)
14.0000 mL/h | INTRAMUSCULAR | Status: DC | PRN
  Administered 2017-07-18: 14 mL/h via EPIDURAL
  Filled 2017-07-18: qty 100

## 2017-07-18 MED ORDER — BENZOCAINE-MENTHOL 20-0.5 % EX AERO
1.0000 "application " | INHALATION_SPRAY | CUTANEOUS | Status: DC | PRN
  Filled 2017-07-18: qty 56

## 2017-07-18 MED ORDER — BUTORPHANOL TARTRATE 1 MG/ML IJ SOLN: 1.0000 mg | INTRAMUSCULAR | Status: DC | PRN

## 2017-07-18 MED ORDER — ONDANSETRON HCL 4 MG PO TABS
4.0000 mg | ORAL_TABLET | ORAL | Status: DC | PRN
Start: 2017-07-18 — End: 2017-07-20
  Administered 2017-07-20: 4 mg via ORAL
  Filled 2017-07-18: qty 1

## 2017-07-18 MED ORDER — SIMETHICONE 80 MG PO CHEW
80.0000 mg | CHEWABLE_TABLET | ORAL | Status: DC | PRN
  Administered 2017-07-19: 80 mg via ORAL
  Filled 2017-07-18: qty 1

## 2017-07-18 MED ORDER — IBUPROFEN 600 MG PO TABS
600.0000 mg | ORAL_TABLET | Freq: Four times a day (QID) | ORAL | Status: DC
  Administered 2017-07-18 – 2017-07-20 (×7): 600 mg via ORAL
  Filled 2017-07-18 (×6): qty 1

## 2017-07-18 MED ORDER — OXYCODONE-ACETAMINOPHEN 5-325 MG PO TABS: 2.0000 | ORAL_TABLET | ORAL | Status: DC | PRN

## 2017-07-18 MED ORDER — LACTATED RINGERS IV SOLN: 500.0000 mL | Freq: Once | INTRAVENOUS | Status: DC

## 2017-07-18 MED ORDER — OXYCODONE HCL 5 MG PO TABS: 5.0000 mg | ORAL_TABLET | ORAL | Status: DC | PRN

## 2017-07-18 NOTE — Progress Notes (Signed)
Operative Delivery Note At 4:34 PM a viable female was delivered via Vaginal, Spontaneous Delivery.  Presentation: vertex; Position: Left,, Occiput,, Transverse; Station: +3.  Verbal consent: obtained from patient.  Risks and benefits discussed in detail.  Risks include, but are not limited to the risks of anesthesia, bleeding, infection, damage to maternal tissues, fetal cephalhematoma.  There is also the risk of inability to effect vaginal delivery of the head, or shoulder dystocia that cannot be resolved by established maneuvers, leading to the need for emergency cesarean section. Bradycardia noted. D/W patient. Bell VE - 1 popoff, good progress. Shoulder dystocia with right shoulder anteriior. McRoberts position, second degree MLE, suprapubic pressure. About 30 seconds. Gentle traction. APGAR: 9, 10; weight  .   Placenta status:intact , .   Cord: 3 vessels with the following complications: .  Cord pH: pending  Anesthesia:  epidural Instruments: Bell VE Episiotomy: Median second degree repaired Lacerations: None Suture Repair: 2.0 vicryl rapide Est. Blood Loss (mL):  250  Mom to postpartum.  Baby to Couplet care / Skin to Skin.  Allison Hicks II,Allison Hicks E 07/18/2017, 4:56 PM

## 2017-07-18 NOTE — Anesthesia Postprocedure Evaluation (Signed)
Anesthesia Post Note  Patient: Allison Hicks  Procedure(s) Performed: * No procedures listed *     Patient location during evaluation: Mother Baby Anesthesia Type: General Level of consciousness: awake and alert and oriented Pain management: satisfactory to patient Vital Signs Assessment: post-procedure vital signs reviewed and stable Respiratory status: spontaneous breathing and nonlabored ventilation Cardiovascular status: stable Postop Assessment: no headache, no backache, no signs of nausea or vomiting, adequate PO intake and patient able to bend at knees (patient up walking) Anesthetic complications: no    Last Vitals:  Vitals:   07/18/17 1832 07/18/17 1940  BP: 99/71 94/61  Pulse: 84 87  Resp: 16 16  Temp: (!) 36.4 C 36.7 C  SpO2:      Last Pain:  Vitals:   07/18/17 1940  TempSrc: Oral  PainSc: 0-No pain   Pain Goal:                 Kaipo Ardis

## 2017-07-18 NOTE — Progress Notes (Signed)
FHT cat one UCs irregular Cx 3-4/80/-2/vtx AROM clear

## 2017-07-18 NOTE — Anesthesia Preprocedure Evaluation (Signed)
Anesthesia Evaluation  Patient identified by MRN, date of birth, ID band Patient awake    Reviewed: Allergy & Precautions, H&P , NPO status , Patient's Chart, lab work & pertinent test results, reviewed documented beta blocker date and time   Airway Mallampati: II  TM Distance: >3 FB Neck ROM: full    Dental no notable dental hx.    Pulmonary asthma ,    Pulmonary exam normal breath sounds clear to auscultation       Cardiovascular Exercise Tolerance: Good negative cardio ROS   Rhythm:regular Rate:Normal     Neuro/Psych negative neurological ROS  negative psych ROS   GI/Hepatic negative GI ROS, Neg liver ROS,   Endo/Other  negative endocrine ROS  Renal/GU negative Renal ROS  negative genitourinary   Musculoskeletal   Abdominal   Peds  Hematology negative hematology ROS (+)   Anesthesia Other Findings   Reproductive/Obstetrics (+) Pregnancy                             Anesthesia Physical Anesthesia Plan  ASA: II  Anesthesia Plan: General   Post-op Pain Management:    Induction:   PONV Risk Score and Plan:   Airway Management Planned:   Additional Equipment:   Intra-op Plan:   Post-operative Plan:   Informed Consent: I have reviewed the patients History and Physical, chart, labs and discussed the procedure including the risks, benefits and alternatives for the proposed anesthesia with the patient or authorized representative who has indicated his/her understanding and acceptance.   Dental Advisory Given  Plan Discussed with: CRNA  Anesthesia Plan Comments:         Anesthesia Quick Evaluation

## 2017-07-18 NOTE — Anesthesia Pain Management Evaluation Note (Signed)
  CRNA Pain Management Visit Note  Patient: Allison Hicks, 31 y.o., female  "Hello I am a member of the anesthesia team at [facility]. We have an anesthesia team available at all times to provide care throughout the hospital, including epidural management and anesthesia for C-section. I don't know your plan for the delivery whether it a natural birth, water birth, IV sedation, nitrous supplementation, doula or epidural, but we want to meet your pain goals."   1.Was your pain managed to your expectations on prior hospitalizations?   Yes   2.What is your expectation for pain management during this hospitalization?     Epidural and IV pain meds  3.How can we help you reach that goal? Be available  Record the patient's initial score and the patient's pain goal.   Pain: 0  Pain Goal: 5 [facility] wants you to be able to say your pain was always managed very well.  [facility] 07/18/2017

## 2017-07-18 NOTE — Anesthesia Procedure Notes (Signed)
Epidural Patient location during procedure: OB Start time: 07/18/2017 12:02 PM End time: 07/18/2017 12:14 PM  Staffing Anesthesiologist: Duane Boston Performed: anesthesiologist   Preanesthetic Checklist Completed: patient identified, site marked, pre-op evaluation, timeout performed, IV checked, risks and benefits discussed and monitors and equipment checked  Epidural Patient position: sitting Prep: DuraPrep Patient monitoring: heart rate, cardiac monitor, continuous pulse ox and blood pressure Approach: midline Location: L2-L3 Injection technique: LOR saline  Needle:  Needle type: Tuohy  Needle gauge: 17 G Needle length: 9 cm Needle insertion depth: 6 cm Catheter size: 20 Guage Catheter at skin depth: 11 cm Test dose: negative and Other  Assessment Events: blood not aspirated, injection not painful, no injection resistance and negative IV test  Additional Notes Informed consent obtained prior to proceeding including risk of failure, 1% risk of PDPH, risk of minor discomfort and bruising.  Discussed rare but serious complications including epidural abscess, permanent nerve injury, epidural hematoma.  Discussed alternatives to epidural analgesia and patient desires to proceed.  Timeout performed pre-procedure verifying patient name, procedure, and platelet count.  Patient tolerated procedure well.

## 2017-07-19 LAB — CBC
HCT: 27.1 % — ABNORMAL LOW (ref 36.0–46.0)
HEMOGLOBIN: 9.5 g/dL — AB (ref 12.0–15.0)
MCH: 30.4 pg (ref 26.0–34.0)
MCHC: 35.1 g/dL (ref 30.0–36.0)
MCV: 86.9 fL (ref 78.0–100.0)
PLATELETS: 181 10*3/uL (ref 150–400)
RBC: 3.12 MIL/uL — AB (ref 3.87–5.11)
RDW: 13.2 % (ref 11.5–15.5)
WBC: 14.7 10*3/uL — ABNORMAL HIGH (ref 4.0–10.5)

## 2017-07-19 MED ORDER — INFLUENZA VAC SPLIT QUAD 0.5 ML IM SUSY
0.5000 mL | PREFILLED_SYRINGE | INTRAMUSCULAR | Status: AC
  Administered 2017-07-20: 0.5 mL via INTRAMUSCULAR

## 2017-07-19 MED ORDER — CALCIUM CARBONATE ANTACID 500 MG PO CHEW
1.0000 | CHEWABLE_TABLET | Freq: Four times a day (QID) | ORAL | Status: DC | PRN
  Administered 2017-07-19: 200 mg via ORAL
  Filled 2017-07-19: qty 1

## 2017-07-19 NOTE — Progress Notes (Signed)
RN in room to do morning vitals and administer 0600 motrin. Patient has urge to void, able to use right leg better to go to the bathroom, with precautionary use of stedy. When sitting on toilet she had what sounded like 5 clots into the toilet, able to void. Patient back to bed for assessment with assistance from second RN. Unable to assess clots in toilet due to toilet being flushed. Fundal check performed in bed with one additional clot passed, golfball size. Patient calm, not symptomatic, nausea from previous note resolved. Dr. Gaetano Net updated on patient care. No new orders at the moment.

## 2017-07-19 NOTE — Plan of Care (Signed)
Problem: Life Cycle: Goal: Risk for postpartum hemorrhage will decrease Outcome: Progressing Monitoring the patients bleeding based on nurses judgement. Patient had EBL of 200 in L&D. Received in report from dayshift RN that patient had another 351 blood loss when transferred to [facility], for total of 550 EBL. During 2 fundal assessments, patient has small-moderate bleeding on peri-pad, measuring total of 285 but is firm, u/1 with no active bleeding, odor, or clots during fundal press.   Patient voidedx1 using bedside commode, right leg weakness. Patient c/o nausea and Zofran administered IV. Last BP 96/69, following trend. Patient states she normally has low BPs.  Nurse will continue to monitor.

## 2017-07-19 NOTE — Progress Notes (Signed)
Patient doing well No complaints. BP 103/66 (BP Location: Left Arm)   Pulse 80   Temp 98 F (36.7 C) (Oral)   Resp 16   Ht 5\' 6"  (1.676 m)   Wt 79.8 kg (176 lb)   LMP 10/12/2016   SpO2 98%   Breastfeeding? Unknown   BMI 28.41 kg/m  Results for orders placed or performed during the hospital encounter of 07/18/17 (from the past 24 hour(s))  LAB REPORT - SCANNED     Status: None   Collection Time: 07/18/17  1:54 PM   Narrative   Ordered by an unspecified provider.  CBC     Status: Abnormal   Collection Time: 07/19/17  5:25 AM  Result Value Ref Range   WBC 14.7 (H) 4.0 - 10.5 K/uL   RBC 3.12 (L) 3.87 - 5.11 MIL/uL   Hemoglobin 9.5 (L) 12.0 - 15.0 g/dL   HCT 27.1 (L) 36.0 - 46.0 %   MCV 86.9 78.0 - 100.0 fL   MCH 30.4 26.0 - 34.0 pg   MCHC 35.1 30.0 - 36.0 g/dL   RDW 13.2 11.5 - 15.5 %   Platelets 181 150 - 400 K/uL   Abdomen is soft and non tender  PPD # 1  Doing well Routine care Discharge tomorrow

## 2017-07-19 NOTE — Progress Notes (Signed)
Called to see patient re: Numb right foot.  Pt cannot walk well and feels like the foot is numb. She has numbness over her complete right leg, more laterally than medially and normal above the knee. She has normal sensation of the smallest toe. She cannot dorsiflex the foot.  Plantar flexion is strong.  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  Her baby was malrotated and she was in stirrups for perhaps an hour; probably not long enough for a peroneal nerve injury from stirrups. There were no epidural associated paresthesias or complications noted with placement.   She may benefit from an OT or PT consult to brace her foot and aid her ambulation.  I suspect it will resolve with time but I told her this might be quite a while `( maybe 2 months)  I offered to get neurology consult , she is not so inclined.

## 2017-07-19 NOTE — Lactation Note (Signed)
This note was copied from a baby's chart. Lactation Consultation Note  Patient Name: Allison Hicks [id number] Date: 07/19/2017 Initial lactation consult : mom is an experienced breast feeding mother, and this is her 3rd baby.  LC entered the room and baby already latched in the cradle position. Per mom felt some snugness with latch  Until she eased the baby's chin downward and then was more comfortable.  LC checked the latched , depth achieved and flipped upper lip to be more flanged and ease chin down more. FISH Lips  Improved. LC enc to use the breast compressions intermittently and increased swallows noted . Baby fed for 15 mins and nipple well rounded when baby released and baby was satisfied.  With moms permission checked areola tissue for compressibility and noted the edema on both sides , And LC suspects this is causing the snug feeling mom is feeling.  LC recommended breast massage , hand express, pre-pump if needed and reverse pressure to make the nipple areola more elastic for a deeper latch to prevent soreness, or at least breast massage, hand express, reverse pressure.  Rotate between at least 2 breast positions. LC instructed mom on the hand pump ( increasing flange to #27 if needed ) and breast shells.  Mother informed of post-discharge support and given phone number to the lactation department, including services for phone call assistance; out-patient appointments; and breastfeeding support group. List of other breastfeeding resources in the community given in the handout. Encouraged mother to call for problems or concerns related to breastfeeding.   Maternal Data Has patient been taught Hand Expression?: Yes (LC reviewed and per mom familiar with technique ) Does the patient have breastfeeding experience prior to this delivery?: Yes  Feeding Feeding Type:  (baby already latched ) Length of feed: 15 min  LATCH Score Latch:  (latched with depth )  Audible Swallowing:   (multple swallows, and increased with compressions )  Type of Nipple:  (nipple well rounded when the baby released )  Comfort (Breast/Nipple):  (mom comfortable when LC present )  Hold (Positioning):  (mom independent with latch )     Interventions Interventions: Breast feeding basics reviewed;Skin to skin;Shells;Hand pump  Lactation Tools Discussed/Used Tools: Shells;Pump;Flanges Flange Size: 27 Shell Type: Inverted;Other (comment) (due to areola edema and per mom th latch feels snug ) Breast pump type: Manual WIC Program: No Pump Review: Setup, frequency, and cleaning Initiated by:: MAI  Date initiated:: 07/19/17   Consult Status Consult Status: Follow-up Date: 07/20/17 Follow-up type: In-patient    [place] 07/19/2017, 1:19 PM

## 2017-07-20 NOTE — Discharge Summary (Signed)
Obstetric Discharge Summary Reason for Admission: induction of labor Prenatal Procedures: none Intrapartum Procedures: spontaneous vaginal delivery Postpartum Procedures: none Complications-Operative and Postpartum: numbness in R foot/leg that is improving. Declined PT or neuro consult. Able to ambulate without assistance. Hemoglobin  Date Value Ref Range Status  07/19/2017 9.5 (L) 12.0 - 15.0 g/dL Final    Comment:    DELTA CHECK NOTED REPEATED TO VERIFY    HCT  Date Value Ref Range Status  07/19/2017 27.1 (L) 36.0 - 46.0 % Final    Physical Exam:  General: alert, cooperative and appears stated age 31: appropriate Uterine Fundus: firm Incision: N/A DVT Evaluation: No evidence of DVT seen on physical exam. Negative Homan's sign. No cords or calf tenderness. No significant calf/ankle edema. R leg with continued mild numbness, but able to ambulate without issues.   Discharge Diagnoses: Term Pregnancy-delivered  Discharge Information: Date: 07/20/2017 Activity: pelvic rest Diet: routine Medications: None Condition: stable Instructions: refer to practice specific booklet Discharge to: home   Newborn Data: Live born female  Birth Weight: 9 lb 13.2 oz (4457 g) APGAR: 9, 10  Home with mother.  Tyson Dense 07/20/2017, 8:48 AM

## 2017-07-20 NOTE — Addendum Note (Signed)
Addendum  created 07/20/17 0109 by Lyndle Herrlich, MD   Sign clinical note

## 2017-07-20 NOTE — Lactation Note (Signed)
This note was copied from a baby's chart. Lactation Consultation Note  Patient Name: Allison Hicks [id number] Date: 07/20/2017 Reason for consult: Follow-up assessment   P3, baby 31 hours old.  Mother has bilateral nipple abrasions. Demonstrated how to perform chin tug to alleviate initial pain. Recommend not using lanolin but coconut oil and ebm.   Mother latched baby in cross cradle.  Intermittent swallows observed w/ compression. Mother has shells and comfort gels. Discussed milk storage.  Mother has manual pump and #27 flanges. Suggest if pain continues call LC and call OB for APNO. Mom encouraged to feed baby 8-12 times/24 hours and with feeding cues.  Reviewed engorgement care and monitoring voids/stools.    Maternal Data    Feeding Feeding Type: Breast Fed Length of feed: 15 min  LATCH Score Latch: Grasps breast easily, tongue down, lips flanged, rhythmical sucking.  Audible Swallowing: A few with stimulation  Type of Nipple: Everted at rest and after stimulation  Comfort (Breast/Nipple): Filling, red/small blisters or bruises, mild/mod discomfort  Hold (Positioning): No assistance needed to correctly position infant at breast.  LATCH Score: 8  Interventions Interventions: Breast feeding basics reviewed;Reverse pressure;Breast compression;Expressed milk;Shells;Comfort gels;Hand pump  Lactation Tools Discussed/Used Tools: Shells;Pump;Comfort gels Flange Size: 27 Shell Type: Inverted Breast pump type: Manual   Consult Status Consult Status: Complete    Carlye Grippe 07/20/2017, 9:42 AM

## 2017-07-20 NOTE — Plan of Care (Signed)
Problem: Activity: Goal: Ability to tolerate increased activity will improve Outcome: Adequate for Discharge Patients R foot is still numb 30 hours post partum. She states that she is able to walk but her husband assists her. Anesthesia notified.

## 2017-07-22 ENCOUNTER — Telehealth: Payer: 59 | Admitting: Nurse Practitioner

## 2017-07-22 DIAGNOSIS — N3000 Acute cystitis without hematuria: Secondary | ICD-10-CM | POA: Diagnosis not present

## 2017-07-22 MED ORDER — CEPHALEXIN 500 MG PO CAPS: 500.0000 mg | ORAL_CAPSULE | Freq: Two times a day (BID) | ORAL | 0 refills | Status: DC

## 2017-07-22 NOTE — Progress Notes (Signed)

## 2017-08-02 DIAGNOSIS — N390 Urinary tract infection, site not specified: Secondary | ICD-10-CM | POA: Diagnosis not present

## 2017-08-22 DIAGNOSIS — Z1389 Encounter for screening for other disorder: Secondary | ICD-10-CM | POA: Diagnosis not present

## 2017-09-06 DIAGNOSIS — Z3202 Encounter for pregnancy test, result negative: Secondary | ICD-10-CM | POA: Diagnosis not present

## 2017-09-06 DIAGNOSIS — Z30430 Encounter for insertion of intrauterine contraceptive device: Secondary | ICD-10-CM | POA: Diagnosis not present

## 2017-10-03 ENCOUNTER — Telehealth: Payer: 59 | Admitting: Family

## 2017-10-03 DIAGNOSIS — R21 Rash and other nonspecific skin eruption: Secondary | ICD-10-CM

## 2017-10-03 MED ORDER — BETAMETHASONE VALERATE 0.1 % EX OINT: 1.0000 "application " | TOPICAL_OINTMENT | Freq: Two times a day (BID) | CUTANEOUS | 0 refills | Status: DC

## 2017-10-03 MED FILL — BETAMETHASONE VALER 0.1% OI: 0.1 | 15 days supply | Qty: 30 | Fill #0

## 2017-10-03 NOTE — Progress Notes (Signed)
E Visit for Rash  We are sorry that you are not feeling well. Here is how we plan to help!    Based upon your presentation it appears you have a rash.  have prescribed: betamethasone valerate ointment to apply twice daily to the area.   HOME CARE:   Take cool showers and avoid direct sunlight.  Apply cool compress or wet dressings.  Take a bath in an oatmeal bath.  Sprinkle content of one Aveeno packet under running faucet with comfortably warm water.  Bathe for 15-20 minutes, 1-2 times daily.  Pat dry with a towel. Do not rub the rash.  Use hydrocortisone cream.  Take an antihistamine like Benadryl for widespread rashes that itch.  The adult dose of Benadryl is 25-50 mg by mouth 4 times daily.  Caution:  This type of medication may cause sleepiness.  Do not drink alcohol, drive, or operate dangerous machinery while taking antihistamines.  Do not take these medications if you have prostate enlargement.  Read package instructions thoroughly on all medications that you take.  GET HELP RIGHT AWAY IF:   Symptoms don't go away after treatment.  Severe itching that persists.  If you rash spreads or swells.  If you rash begins to smell.  If it blisters and opens or develops a yellow-brown crust.  You develop a fever.  You have a sore throat.  You become short of breath.  MAKE SURE YOU:  Understand these instructions. Will watch your condition. Will get help right away if you are not doing well or get worse.  Thank you for choosing an e-visit. Your e-visit answers were reviewed by a board certified advanced clinical practitioner to complete your personal care plan. Depending upon the condition, your plan could have included both over the counter or prescription medications. Please review your pharmacy choice. Be sure that the pharmacy you have chosen is open so that you can pick up your prescription now.  If there is a problem you may message your provider in [place] to  have the prescription routed to another pharmacy. Your safety is important to [place]. If you have drug allergies check your prescription carefully.  For the next 24 hours, you can use MyChart to ask questions about today's visit, request a non-urgent call back, or ask for a work or school excuse from your e-visit provider. You will get an email in the next two days asking about your experience. I hope that your e-visit has been valuable and will speed your recovery.

## 2017-11-23 DIAGNOSIS — N939 Abnormal uterine and vaginal bleeding, unspecified: Secondary | ICD-10-CM | POA: Diagnosis not present

## 2018-01-16 ENCOUNTER — Telehealth: Payer: Self-pay | Admitting: General Practice

## 2018-01-16 NOTE — Telephone Encounter (Signed)
Patient has sent a request on the web site to become a New Patient of [facility].   Please advise if you accept. Thank you!

## 2018-01-21 NOTE — Telephone Encounter (Signed)
fine

## 2018-01-22 NOTE — Telephone Encounter (Signed)
OV 30 min for new pt scheduled 02/01/18 9:45am with Dr. Sharlet Salina.

## 2018-01-22 NOTE — Telephone Encounter (Signed)
Called and LVM to inform patient.   When patient calls back please set her up a New Patient appointment with Dr.Crawford.   Office type is OFFICE VISIT for 64mins, in the notes NEW PATIENT. Thank you.

## 2018-02-01 ENCOUNTER — Encounter: Payer: Self-pay | Admitting: Internal Medicine

## 2018-02-01 ENCOUNTER — Other Ambulatory Visit (INDEPENDENT_AMBULATORY_CARE_PROVIDER_SITE_OTHER): Payer: 59

## 2018-02-01 ENCOUNTER — Ambulatory Visit: Payer: 59 | Admitting: Internal Medicine

## 2018-02-01 VITALS — BP 110/80 | HR 90 | Temp 98.0°F | Ht 66.0 in | Wt 141.0 lb

## 2018-02-01 DIAGNOSIS — Z0000 Encounter for general adult medical examination without abnormal findings: Secondary | ICD-10-CM

## 2018-02-01 DIAGNOSIS — Z8349 Family history of other endocrine, nutritional and metabolic diseases: Secondary | ICD-10-CM

## 2018-02-01 DIAGNOSIS — G43B0 Ophthalmoplegic migraine, not intractable: Secondary | ICD-10-CM

## 2018-02-01 DIAGNOSIS — G43909 Migraine, unspecified, not intractable, without status migrainosus: Secondary | ICD-10-CM | POA: Insufficient documentation

## 2018-02-01 DIAGNOSIS — E063 Autoimmune thyroiditis: Secondary | ICD-10-CM | POA: Insufficient documentation

## 2018-02-01 DIAGNOSIS — R230 Cyanosis: Secondary | ICD-10-CM | POA: Diagnosis not present

## 2018-02-01 LAB — VITAMIN B12: VITAMIN B 12: 642 pg/mL (ref 211–911)

## 2018-02-01 LAB — CBC
HEMATOCRIT: 40.4 % (ref 36.0–46.0)
Hemoglobin: 13.9 g/dL (ref 12.0–15.0)
MCHC: 34.5 g/dL (ref 30.0–36.0)
MCV: 85.4 fl (ref 78.0–100.0)
PLATELETS: 223 10*3/uL (ref 150.0–400.0)
RBC: 4.73 Mil/uL (ref 3.87–5.11)
RDW: 12.8 % (ref 11.5–15.5)
WBC: 6 10*3/uL (ref 4.0–10.5)

## 2018-02-01 LAB — VITAMIN D 25 HYDROXY (VIT D DEFICIENCY, FRACTURES): VITD: 18.11 ng/mL — AB (ref 30.00–100.00)

## 2018-02-01 LAB — T4, FREE: Free T4: 0.71 ng/dL (ref 0.60–1.60)

## 2018-02-01 LAB — LIPID PANEL
CHOL/HDL RATIO: 2
Cholesterol: 125 mg/dL (ref 0–200)
HDL: 53.7 mg/dL (ref >39.00)
LDL CALC: 62 mg/dL (ref 0–99)
NonHDL: 71.46
TRIGLYCERIDES: 46 mg/dL (ref 0.0–149.0)
VLDL: 9.2 mg/dL (ref 0.0–40.0)

## 2018-02-01 LAB — TSH: TSH: 18.64 u[IU]/mL — ABNORMAL HIGH (ref 0.35–4.50)

## 2018-02-01 NOTE — Progress Notes (Signed)
   Subjective:    Patient ID: Allison Hicks, female    DOB: 1986-10-27, 32 y.o.   MRN: [id number]  HPI The patient is a new 32 YO female coming in for several concerns including blue lips (randomly, just a line around the lips, denies with cold beverages or temperatures, not at exertion, can happen at rest, just happening recently, still nursing her 31 month old), migraine (has had several episodes of vision changes with circular phenomenon in her field of vision then tunnel vision with headache, since October, previously rare headaches, took excedrin which did not help much, sleep seems to help, baby born in September so less sleep and more caffeine in this time, none since December, brother with brain cancer diagnosed in the last year), and thyroid enlargement (someone told her that her thyroid looks large, multiple female family members with thryoid problems, would like to get that checked). Also getting nauseous with breastfeeding.   PMH, [facility], social history reviewed and updated.   Review of Systems  Constitutional: Negative.   HENT: Negative.        Blue lip  Eyes: Positive for visual disturbance.  Respiratory: Negative for cough, chest tightness and shortness of breath.   Cardiovascular: Negative for chest pain, palpitations and leg swelling.  Gastrointestinal: Negative for abdominal distention, abdominal pain, constipation, diarrhea, nausea and vomiting.  Endocrine: Negative.   Musculoskeletal: Negative.   Skin: Negative.   Neurological: Positive for headaches. Negative for dizziness, tremors, seizures, syncope, weakness and light-headedness.  Psychiatric/Behavioral: Negative.       Objective:   Physical Exam  Constitutional: She is oriented to person, place, and time. She appears well-developed and well-nourished.  HENT:  Head: Normocephalic and atraumatic.  Some blue line around the lips, does not change with exertion  Eyes: EOM are normal.  Neck: Normal range of motion.    Cardiovascular: Normal rate and regular rhythm.  Pulmonary/Chest: Effort normal and breath sounds normal. No respiratory distress. She has no wheezes. She has no rales.  Abdominal: Soft. Bowel sounds are normal. She exhibits no distension. There is no tenderness. There is no rebound.  Musculoskeletal: She exhibits no edema.  Neurological: She is alert and oriented to person, place, and time. Coordination normal.  Skin: Skin is warm and dry.  Psychiatric: She has a normal mood and affect.   Vitals:   02/01/18 0949  BP: 110/80  Pulse: 90  Temp: 98 F (36.7 C)  TempSrc: Oral  SpO2: 99%  Weight: 141 lb (64 kg)  Height: 5\' 6"  (1.676 m)      Assessment & Plan:

## 2018-02-01 NOTE — Patient Instructions (Signed)
We will check the labs and send them to you.   If you start getting more headaches let us know.

## 2018-02-01 NOTE — Assessment & Plan Note (Signed)
Do not suspect from cardiopulmonary etiology given that this is not lifelong and not exacerbated by activity or exertion. Could be variant of raynaud's except not driven by cold temp or food. Checking labs for any metabolic etiology or anemia.

## 2018-02-01 NOTE — Assessment & Plan Note (Signed)
Likely from sleep deprivation and increased caffeine after birth of child and possibly hormonal from recent pregnancy and mirena. Given the family history of brother with brain cancer is reasonable to do MRI now. She declines for now and will let us know if they recur or increase in frequency.

## 2018-02-01 NOTE — Assessment & Plan Note (Signed)
Checking TSH and free T4. No excessive bleeding during any delivery to suggest need to check for sheehan.

## 2018-02-12 ENCOUNTER — Encounter: Payer: Self-pay | Admitting: Internal Medicine

## 2018-02-13 ENCOUNTER — Encounter: Payer: Self-pay | Admitting: Internal Medicine

## 2018-02-13 NOTE — Progress Notes (Signed)
Abstracted and sent to scan  

## 2018-04-16 DIAGNOSIS — H5213 Myopia, bilateral: Secondary | ICD-10-CM | POA: Diagnosis not present

## 2018-04-18 MED FILL — OLOPATADINE HCL 0.1% EYE DR: 0.1 | 25 days supply | Qty: 5 | Fill #0

## 2018-04-23 ENCOUNTER — Telehealth: Payer: 59 | Admitting: Physician Assistant

## 2018-04-23 DIAGNOSIS — L255 Unspecified contact dermatitis due to plants, except food: Secondary | ICD-10-CM

## 2018-04-23 MED ORDER — PREDNISONE 20 MG PO TABS: ORAL_TABLET | ORAL | 0 refills | Status: AC

## 2018-04-23 MED ORDER — HYDROXYZINE HCL 10 MG PO TABS: 10.0000 mg | ORAL_TABLET | Freq: Three times a day (TID) | ORAL | 0 refills | Status: DC | PRN

## 2018-04-23 MED FILL — predniSONE 20 MG TABS: 20 | 11 days supply | Qty: 24 | Fill #0

## 2018-04-23 NOTE — Progress Notes (Signed)
E Visit for Rash  We are sorry that you are not feeling well. Here is how we plan to help!  Based on what you shared with me it looks like you have contact dermatitis.  Contact dermatitis is a skin rash caused by something that touches the skin and causes irritation or inflammation.  Your skin may be red, swollen, dry, cracked, and itch.  The rash should go away in a few days but can last a few weeks.  If you get a rash, it's important to figure out what caused it so the irritant can be avoided in the future.    Please take prednisone exactly as prescribed.  It is a slightly longer course because rhus dermatitis may last up to 18 days and can rebound if you don't take prednisone for long enough.  If at any point the rash goes to your face or genitals then please make sure you are seen promptly by you PCP, an urgent care, or if needed the ED.         HOME CARE:   Take cool showers and avoid direct sunlight.  Apply cool compress or wet dressings.  Take a bath in an oatmeal bath.  Sprinkle content of one Aveeno packet under running faucet with comfortably warm water.  Bathe for 15-20 minutes, 1-2 times daily.  Pat dry with a towel. Do not rub the rash.  Use hydrocortisone cream.  Take an antihistamine like Benadryl for widespread rashes that itch.  The adult dose of Benadryl is 25-50 mg by mouth 4 times daily.  Caution:  This type of medication may cause sleepiness.  Do not drink alcohol, drive, or operate dangerous machinery while taking antihistamines.  Do not take these medications if you have prostate enlargement.  Read package instructions thoroughly on all medications that you take.  GET HELP RIGHT AWAY IF:   Symptoms don't go away after treatment.  Severe itching that persists.  If you rash spreads or swells.  If you rash begins to smell.  If it blisters and opens or develops a yellow-brown crust.  You develop a fever.  You have a sore throat.  You become short of  breath.  MAKE SURE YOU:  Understand these instructions. Will watch your condition. Will get help right away if you are not doing well or get worse.  Thank you for choosing an e-visit. Your e-visit answers were reviewed by a board certified advanced clinical practitioner to complete your personal care plan. Depending upon the condition, your plan could have included both over the counter or prescription medications. Please review your pharmacy choice. Be sure that the pharmacy you have chosen is open so that you can pick up your prescription now.  If there is a problem you may message your provider in [place] to have the prescription routed to another pharmacy. Your safety is important to [place]. If you have drug allergies check your prescription carefully.  For the next 24 hours, you can use MyChart to ask questions about today's visit, request a non-urgent call back, or ask for a work or school excuse from your e-visit provider. You will get an email in the next two days asking about your experience. I hope that your e-visit has been valuable and will speed your recovery.

## 2018-04-23 NOTE — Progress Notes (Signed)
Addendum: Phone call to patient roughly 20 minutes after e-visit submission.  Patient made it clear that the rash was NOT on her face or genitals. She was concerned that the rash may spread to those ares. Options discussed and clinic and ED precautions discussed with patient. Patient would like to try PO therapy and agrees to seek face to face care if the rash spreads to her face or genitals. Philis Fendt, MS, PA-C 2:39 PM, 04/23/2018

## 2018-04-24 MED FILL — hydrOXYzine HCL 10 MG TABS: 10 | 4 days supply | Qty: 30 | Fill #0

## 2018-06-27 MED FILL — VIRT-C DHA SOFTGEL: 53.5-38-1 | 90 days supply | Qty: 90 | Fill #0

## 2018-10-31 ENCOUNTER — Telehealth: Payer: 59 | Admitting: Family

## 2018-10-31 DIAGNOSIS — R21 Rash and other nonspecific skin eruption: Secondary | ICD-10-CM | POA: Diagnosis not present

## 2018-10-31 MED ORDER — PREDNISONE 10 MG PO TABS: 10.0000 mg | ORAL_TABLET | Freq: Every day | ORAL | 0 refills | Status: DC

## 2018-10-31 MED FILL — predniSONE 10 MG TABS: 10 | 5 days supply | Qty: 5 | Fill #0

## 2018-10-31 NOTE — Progress Notes (Signed)
Thank you for the details you included in the comment boxes. Those details are very helpful in determining the best course of treatment for you and help us to provide the best care.  E Visit for Rash  We are sorry that you are not feeling well. Here is how we plan to help!  Based on what you shared with me it looks like you have contact dermatitis.  Contact dermatitis is a skin rash caused by something that touches the skin and causes irritation or inflammation.  Your skin may be red, swollen, dry, cracked, and itch.  The rash should go away in a few days but can last a few weeks.  If you get a rash, it's important to figure out what caused it so the irritant can be avoided in the future. and I have prescribed Prednisone 10 mg daily for 5 days   HOME CARE:   Take cool showers and avoid direct sunlight.  Apply cool compress or wet dressings.  Take a bath in an oatmeal bath.  Sprinkle content of one Aveeno packet under running faucet with comfortably warm water.  Bathe for 15-20 minutes, 1-2 times daily.  Pat dry with a towel. Do not rub the rash.  Use hydrocortisone cream.  Take an antihistamine like Benadryl for widespread rashes that itch.  The adult dose of Benadryl is 25-50 mg by mouth 4 times daily.  Caution:  This type of medication may cause sleepiness.  Do not drink alcohol, drive, or operate dangerous machinery while taking antihistamines.  Do not take these medications if you have prostate enlargement.  Read package instructions thoroughly on all medications that you take.  GET HELP RIGHT AWAY IF:   Symptoms don't go away after treatment.  Severe itching that persists.  If you rash spreads or swells.  If you rash begins to smell.  If it blisters and opens or develops a yellow-brown crust.  You develop a fever.  You have a sore throat.  You become short of breath.  MAKE SURE YOU:  Understand these instructions. Will watch your condition. Will get help right away  if you are not doing well or get worse.  Thank you for choosing an e-visit. Your e-visit answers were reviewed by a board certified advanced clinical practitioner to complete your personal care plan. Depending upon the condition, your plan could have included both over the counter or prescription medications. Please review your pharmacy choice. Be sure that the pharmacy you have chosen is open so that you can pick up your prescription now.  If there is a problem you may message your provider in MyChart to have the prescription routed to another pharmacy. Your safety is important to us. If you have drug allergies check your prescription carefully.  For the next 24 hours, you can use MyChart to ask questions about today's visit, request a non-urgent call back, or ask for a work or school excuse from your e-visit provider. You will get an email in the next two days asking about your experience. I hope that your e-visit has been valuable and will speed your recovery.      

## 2018-12-16 ENCOUNTER — Ambulatory Visit (INDEPENDENT_AMBULATORY_CARE_PROVIDER_SITE_OTHER): Payer: 59 | Admitting: Internal Medicine

## 2018-12-16 ENCOUNTER — Other Ambulatory Visit (INDEPENDENT_AMBULATORY_CARE_PROVIDER_SITE_OTHER): Payer: 59

## 2018-12-16 ENCOUNTER — Encounter: Payer: Self-pay | Admitting: Internal Medicine

## 2018-12-16 VITALS — BP 92/60 | HR 68 | Temp 98.0°F | Ht 66.0 in | Wt 149.0 lb

## 2018-12-16 DIAGNOSIS — Z8349 Family history of other endocrine, nutritional and metabolic diseases: Secondary | ICD-10-CM

## 2018-12-16 LAB — T4, FREE: Free T4: 0.78 ng/dL (ref 0.60–1.60)

## 2018-12-16 LAB — TSH: TSH: 6.49 u[IU]/mL — AB (ref 0.35–4.50)

## 2018-12-16 MED ORDER — ALPRAZOLAM 0.25 MG PO TABS: 0.2500 mg | ORAL_TABLET | Freq: Every day | ORAL | 0 refills | Status: DC | PRN

## 2018-12-16 MED FILL — ALPRAZolam 0.25 MG TABS: 0.25 | 10 days supply | Qty: 10 | Fill #0

## 2018-12-16 NOTE — Progress Notes (Signed)
   Subjective:   Patient ID: Allison Hicks, female    DOB: 02-Aug-1986, 33 y.o.   MRN: [id number]  HPI The patient is a 33 YO female coming in for concerns about neck fullness. She does rarely feel like food is getting stuck when she swallows. She does have extensive family history of thyroid problems. She denies nose drainage or sinus problems. She denies fevers or chills. No cough or SOB. Denies GERD symptoms.   Review of Systems  Constitutional: Negative.   HENT: Positive for trouble swallowing.   Eyes: Negative.   Respiratory: Negative for cough, chest tightness and shortness of breath.   Cardiovascular: Negative for chest pain, palpitations and leg swelling.  Gastrointestinal: Negative for abdominal distention, abdominal pain, constipation, diarrhea, nausea and vomiting.  Musculoskeletal: Negative.   Skin: Negative.   Neurological: Negative.   Psychiatric/Behavioral: Negative.     Objective:  Physical Exam Constitutional:      Appearance: She is well-developed.  HENT:     Head: Normocephalic and atraumatic.     Comments: Some redness of the posterior pharynx Neck:     Musculoskeletal: Normal range of motion.     Thyroid: Thyromegaly present.     Comments: Thyroid slightly larger than normal, no masses palpated Cardiovascular:     Rate and Rhythm: Normal rate and regular rhythm.  Pulmonary:     Effort: Pulmonary effort is normal. No respiratory distress.     Breath sounds: Normal breath sounds. No wheezing or rales.  Abdominal:     General: Bowel sounds are normal. There is no distension.     Palpations: Abdomen is soft.     Tenderness: There is no abdominal tenderness. There is no rebound.  Skin:    General: Skin is warm and dry.  Neurological:     Mental Status: She is alert and oriented to person, place, and time.     Coordination: Coordination normal.     Vitals:   12/16/18 1413  BP: 92/60  Pulse: 68  Temp: 98 F (36.7 C)  TempSrc: Oral  SpO2: 98%    Weight: 149 lb (67.6 kg)  Height: 5\' 6"  (1.676 m)    Assessment & Plan:  Visit time 25 minutes: greater than 50% of that time was spent in face to face counseling and coordination of care with the patient: counseled about etiology of her symptoms possibly as well as explanations of thyroid labs and function overall and possibility of cysts or other findings that could cause the neck fullness she is having

## 2018-12-16 NOTE — Patient Instructions (Addendum)
We are checking the labs today and the ultrasound.   We have sent in the xanax to use when flying.

## 2018-12-17 ENCOUNTER — Other Ambulatory Visit: Payer: Self-pay | Admitting: Internal Medicine

## 2018-12-17 ENCOUNTER — Telehealth: Payer: Self-pay | Admitting: Internal Medicine

## 2018-12-17 LAB — THYROID ANTIBODIES
Thyroglobulin Ab: >1000 IU/mL — ABNORMAL HIGH (ref <=1)
Thyroperoxidase Ab SerPl-aCnc: >900 IU/mL — ABNORMAL HIGH (ref <9)

## 2018-12-17 NOTE — Assessment & Plan Note (Signed)
Checking thyroid antibodies, TSH, free T4 and thyroid ultrasound for large thyroid on exam and compressive symptoms.

## 2018-12-17 NOTE — Telephone Encounter (Signed)
Opened in error

## 2018-12-18 ENCOUNTER — Other Ambulatory Visit: Payer: Self-pay | Admitting: Internal Medicine

## 2018-12-18 DIAGNOSIS — Z8349 Family history of other endocrine, nutritional and metabolic diseases: Secondary | ICD-10-CM

## 2018-12-19 ENCOUNTER — Telehealth: Payer: Self-pay | Admitting: Internal Medicine

## 2018-12-19 ENCOUNTER — Ambulatory Visit (INDEPENDENT_AMBULATORY_CARE_PROVIDER_SITE_OTHER): Payer: Self-pay | Admitting: Nurse Practitioner

## 2018-12-19 VITALS — BP 114/64 | HR 83 | Temp 98.2°F | Wt 148.6 lb

## 2018-12-19 DIAGNOSIS — E049 Nontoxic goiter, unspecified: Secondary | ICD-10-CM

## 2018-12-19 DIAGNOSIS — R0600 Dyspnea, unspecified: Secondary | ICD-10-CM

## 2018-12-19 MED ORDER — ALBUTEROL SULFATE HFA 108 (90 BASE) MCG/ACT IN AERS: 2.0000 | INHALATION_SPRAY | Freq: Four times a day (QID) | RESPIRATORY_TRACT | 0 refills | Status: DC | PRN

## 2018-12-19 MED FILL — VENTOLIN HFA 90 MCG INHALER: 108 (90 BAS | 25 days supply | Qty: 18 | Fill #0

## 2018-12-19 NOTE — Patient Instructions (Addendum)
Shortness of Breath, Adult -Use your albuterol inhaler as needed.  I feel your shortness of breath is most likely related to a panic attack or anxiety.  If your shortness of breath worsens, you have worsening trouble breathing, or shortness of breath, go to the emergency department immediately. -Take the Xanax you have at home for calming. -Make sure you keep your appointment on Monday for your thyroid ultrasound. -Follow-up with your PCP within the next 5 to 7 days.   Shortness of breath is when a person has trouble breathing enough air or when a person feels like she or he is having trouble breathing in enough air. Shortness of breath could be a sign of a medical problem. Follow these instructions at home:   Pay attention to any changes in your symptoms.  Do not use any products that contain nicotine or tobacco, such as cigarettes, e-cigarettes, and chewing tobacco.  Do not smoke. Smoking is a common cause of shortness of breath. If you need help quitting, ask your health care provider.  Avoid things that can irritate your airways, such as: ? Mold. ? Dust. ? Air pollution. ? Chemical fumes. ? Things that can cause allergy symptoms (allergens), if you have allergies.  Keep your living space clean and free of mold and dust.  Rest as needed. Slowly return to your usual activities.  Take over-the-counter and prescription medicines only as told by your health care provider. This includes oxygen therapy and inhaled medicines.  Keep all follow-up visits as told by your health care provider. This is important. Contact a health care provider if:  Your condition does not improve as soon as expected.  You have a hard time doing your normal activities, even after you rest.  You have new symptoms. Get help right away if:  Your shortness of breath gets worse.  You have shortness of breath when you are resting.  You feel light-headed or you faint.  You have a cough that is not  controlled with medicines.  You cough up blood.  You have pain with breathing.  You have pain in your chest, arms, shoulders, or abdomen.  You have a fever.  You cannot walk up stairs or exercise the way that you normally do. These symptoms may represent a serious problem that is an emergency. Do not wait to see if the symptoms will go away. Get medical help right away. Call your local emergency services (911 in the [place] Do not drive yourself to the hospital. Summary  Shortness of breath is when a person has trouble breathing enough air. It can be a sign of a medical problem.  Avoid things that irritate your lungs, such as smoking, pollution, mold, and dust.  Pay attention to changes in your symptoms and contact your health care provider if you have a hard time completing daily activities because of shortness of breath. This information is not intended to replace advice given to you by your health care provider. Make sure you discuss any questions you have with your health care provider. Document Released: 07/18/2001 Document Revised: 03/25/2018 Document Reviewed: 03/25/2018 Elsevier Interactive Patient Education  2019 [employer]  A goiter is an enlarged thyroid gland. The thyroid is located in the lower front of the neck. It makes hormones that affect many body parts and systems, including the system that affects how quickly the body burns fuel for energy (metabolism). Most goiters are painless and are not a cause for concern. Some goiters can affect the  way your thyroid makes thyroid hormones. Goiters and conditions that cause goiters can be treated, if necessary. What are the causes? Common causes of this condition include:  Lack (deficiency) of a mineral called iodine. The thyroid gland uses iodine to make thyroid hormones.  Diseases that attack healthy cells in the body (autoimmune diseases) and affect thyroid function, such as Graves' disease or Hashimoto's  disease. These diseases may cause the body to produce too much thyroid hormone (hyperthyroidism) or too little of the hormone (hypothyroidism).  Conditions that cause inflammation of the thyroid (thyroiditis).  One or more small growths on the thyroid (nodular goiter). Other causes include:  Medical problems caused by abnormal genes that are passed from parent to child (genetic defects).  Thyroid injury or infection.  Tumors that may or may not be cancerous.  Pregnancy.  Certain medicines.  Exposure to radiation. In some cases, the cause may not be known. What increases the risk? This condition is more likely to develop in:  People who do not get enough iodine in their diet.  People who have a family history of goiter.  Women.  People who are older than age 60.  People who smoke tobacco.  People who have had exposure to radiation. What are the signs or symptoms? The main symptom of this condition is swelling in the lower, front part of the neck. This swelling can range from a very small bump to a large lump. Other symptoms may include:  A tight feeling in the throat.  A hoarse voice.  Coughing.  Wheezing.  Difficulty swallowing or breathing.  Bulging veins in the neck.  Dizziness. When a goiter is the result of an overactive thyroid (hyperthyroidism), symptoms may also include:  Nervousness or restlessness.  Inability to tolerate heat.  Unexplained weight loss.  Diarrhea.  Change in the texture of hair or skin.  Changes in heartbeat, such as skipped beats, extra beats, or a rapid heart rate.  Loss of menstruation.  Shaky hands.  Increased appetite.  Sleep problems. When a goiter is the result of an underactive thyroid (hypothyroidism), symptoms may also include:  Feeling like you have no energy (lethargy).  Inability to tolerate cold.  Weight gain that is not explained by a change in diet or exercise habits.  Dry skin.  Coarse  hair.  Irregular menstrual periods.  Constipation.  Sadness or depression.  Fatigue. In some cases, there may not be any symptoms and the thyroid hormone levels may be normal. How is this diagnosed? This condition may be diagnosed based on your symptoms, your medical history, and a physical exam. You may have tests, such as:  Blood tests to check thyroid function.  Imaging tests, such as: ? Ultrasound. ? CT scan. ? MRI. ? Thyroid scan.  Removal of a tissue sample (biopsy) of the goiter or any nodules. The sample will be tested to check for cancer. How is this treated? Treatment for this condition depends on the cause and your symptoms. Treatment may include:  Medicines to regulate thyroid hormone levels.  Anti-inflammatory medicines or steroid medicines, if the goiter is caused by inflammation.  Iodine supplements or changes to your diet, if the goiter is caused by iodine deficiency.  Radioactive iodine treatment.  Surgery to remove your thyroid. In some cases, you may only need regular check-ups with your health care provider to monitor your condition, and you may not need treatment. Follow these instructions at home:  Follow instructions from your health care provider about any changes  to your diet.  Take over-the-counter and prescription medicines only as told by your health care provider. These include supplements.  Do not use any products that contain nicotine or tobacco, such as cigarettes and e-cigarettes. If you need help quitting, ask your health care provider.  Keep all follow-up visits as told by your health care provider. This is important. Contact a health care provider if:  Your symptoms do not get better with treatment.  You have nausea, vomiting, or diarrhea. Get help right away if:  You have sudden, unexplained confusion or other mental changes.  You have a fever.  You have chest pain.  You have trouble breathing or swallowing.  You suddenly  become very weak.  You experience extreme restlessness.  You feel your heart racing. Summary  A goiter is an enlarged thyroid gland.  The thyroid gland is located in the lower front of the neck. It makes hormones that affect many body parts and systems, including the system that affects how quickly the body burns fuel for energy (metabolism).  The main symptom of this condition is swelling in the lower, front part of the neck. This swelling can range from a very small bump to a large lump.  Treatment for this condition depends on the cause and your symptoms. You may need medicines, supplements, or regular monitoring of your condition. This information is not intended to replace advice given to you by your health care provider. Make sure you discuss any questions you have with your health care provider. Document Released: 04/12/2010 Document Revised: 07/19/2017 Document Reviewed: 07/19/2017 Elsevier Interactive Patient Education  [employer].

## 2018-12-19 NOTE — Telephone Encounter (Signed)
Copied from [place] [phone]. Topic: General - Other >> Dec 19, 2018  5:01 PM Keene Breath wrote: Reason for CRM: Patient called to inquire about a referral that Dr. Sharlet Salina was going to submit.  Patient is a little anxious and wanted to know where the referral was being ordered.  Please contact patient when the information is available.  Doctor or nurse can leave a detailed message at [phone]

## 2018-12-19 NOTE — Progress Notes (Addendum)
Subjective:    Patient ID: Allison Hicks, female    DOB: April 26, 1986, 33 y.o.   MRN: [id number]  The patient is a 33 year old female who presents today with complaints of shortness of breath and pressure in her right neck.  The patient informs that she was just recently seen at her PCPs office on 12/16/2018 for thyroid issues.  The patient informed that labs were ordered at that and an ultrasound has been scheduled along with a referral to endocrinology.  Patient states today while she was at work she just became short of breath and felt like she had something pressing on the right side of her throat/neck.  Patient also states that it was more difficult for her to take a deep breath.  Patient informs that she has had increased anxiety and also wonders if she could be having a panic attack.  The patient denies drooling, difficulty speaking, fever, cough, congestion, runny nose, or other upper respiratory symptoms.  Patient further denies wheezing or sputum production.  Patient also informs that she does have a history of exercise-induced asthma for which she had an albuterol inhaler for years ago.  Patient also endorses a family history of thyroid disease.   Patient states that she also feels she may need another albuterol inhaler as she feels like she cannot really determine where her shortness of breath is originating.  Shortness of Breath  This is a new problem. The current episode started in the past 7 days. Pertinent negatives include no abdominal pain, chest pain, claudication, orthopnea, rhinorrhea, sore throat, sputum production, vomiting or wheezing. Nothing aggravates the symptoms. The patient has no known risk factors for DVT/PE. She has tried nothing for the symptoms. Her past medical history is significant for allergies and asthma.   Past Medical History:  Diagnosis Date  . Allergy   . Asthma   . Headache   . Migraines     Review of Systems  Constitutional: Negative.   HENT: Positive  for trouble swallowing. Negative for rhinorrhea, sore throat and voice change.   Eyes: Negative.   Respiratory: Positive for shortness of breath. Negative for sputum production and wheezing.   Cardiovascular: Negative.  Negative for chest pain, orthopnea and claudication.  Gastrointestinal: Negative.  Negative for abdominal pain and vomiting.  Skin: Negative.   Neurological: Positive for dizziness.       Objective: Blood pressure 114/64, pulse 83, temperature 98.2 F (36.8 C), temperature source Oral, weight 148 lb 9.6 oz (67.4 kg), SpO2 98 %.   Physical Exam Constitutional:      General: She is not in acute distress. HENT:     Head: Normocephalic.     Right Ear: Tympanic membrane, ear canal and external ear normal.     Left Ear: Tympanic membrane, ear canal and external ear normal.     Nose: Nose normal.  Eyes:     Pupils: Pupils are equal, round, and reactive to light.  Neck:     Musculoskeletal: Normal range of motion. No neck rigidity.     Thyroid: Thyromegaly present.     Trachea: Trachea normal. No abnormal tracheal secretions or tracheal deviation.      Comments: + thyromegaly, right neck Cardiovascular:     Rate and Rhythm: Normal rate and regular rhythm.     Pulses: Normal pulses.     Heart sounds: Normal heart sounds.  Pulmonary:     Effort: Pulmonary effort is normal. No respiratory distress.     Breath sounds:  Normal breath sounds. No stridor. No wheezing, rhonchi or rales.  Abdominal:     General: Abdomen is flat.  Lymphadenopathy:     Cervical: No cervical adenopathy.  Skin:    General: Skin is warm and dry.     Capillary Refill: Capillary refill takes less than 2 seconds.  Neurological:     General: No focal deficit present.     Mental Status: She is alert.     Cranial Nerves: No cranial nerve deficit.  Psychiatric:        Mood and Affect: Mood normal.        Thought Content: Thought content normal.       Assessment & Plan:  Exam findings,  diagnosis etiology and medication use and indications reviewed with patient. Follow- Up and discharge instructions provided. No emergent/urgent issues found on exam.  I had a long discussion with the patient regarding the symptoms that she is currently having.  I informed the patient that I feel her symptoms are related to her enlarged thyroid and in turn this is causing her some anxiety.  Informed the patient that if she does have the prescription that was prescribed by her doctor for her Xanax to go home and take that, and that I will also prescribe her an albuterol inhaler for peace of mind regarding her breathing.  Patient was tearful during this discussion, but think me for listening to her during this time.  Informed the patient that if her symptoms did worsen or seem severe before she was able to have her ultrasound or have her follow-up appointment, she needed to go to the emergency department.  At time of discharge patient did not display any shortness of breath or difficulty breathing.  Patient was not in any acute distress, lungs CTAB.  Patient education was provided for shortness of breath and goiter in order to give the patient some information regarding her symptoms although this is not a finite diagnosis.. Patient verbalized understanding of information provided and agrees with plan of care (POC), all questions answered. The patient is advised to call or return to clinic if condition does not see an improvement in symptoms, or to seek the care of the closest emergency department if condition worsens with the above plan.   1. Dyspnea, unspecified type  - albuterol (PROVENTIL HFA;VENTOLIN HFA) 108 (90 Base) MCG/ACT inhaler; Inhale 2 puffs into the lungs every 6 (six) hours as needed for up to 10 days.  Dispense: 1 Inhaler; Refill: 0  2. Enlarged thyroid  -Use your albuterol inhaler as needed.  I feel your shortness of breath is most likely related to a panic attack or anxiety.  If your shortness  of breath worsens, you have worsening trouble breathing, or shortness of breath, go to the emergency department immediately. -Take the Xanax you have at home for calming. -Make sure you keep your appointment on Monday for your thyroid ultrasound. -Follow-up with your PCP within the next 5 to 7 days.

## 2018-12-20 NOTE — Telephone Encounter (Signed)
Referral was sent to [facility] - left detailed msg on pt's vm

## 2018-12-20 NOTE — Telephone Encounter (Signed)
This was put in two days ago, was not sure if you knew where this was going to be sent

## 2018-12-23 ENCOUNTER — Ambulatory Visit
Admission: RE | Admit: 2018-12-23 | Discharge: 2018-12-23 | Disposition: A | Discharge status: Home / Self Care | Payer: 59 | Source: Ambulatory Visit | Attending: Internal Medicine | Admitting: Internal Medicine

## 2018-12-23 DIAGNOSIS — Z8349 Family history of other endocrine, nutritional and metabolic diseases: Secondary | ICD-10-CM

## 2018-12-23 DIAGNOSIS — E049 Nontoxic goiter, unspecified: Secondary | ICD-10-CM | POA: Diagnosis not present

## 2018-12-25 DIAGNOSIS — Z6824 Body mass index (BMI) 24.0-24.9, adult: Secondary | ICD-10-CM | POA: Diagnosis not present

## 2018-12-25 DIAGNOSIS — E042 Nontoxic multinodular goiter: Secondary | ICD-10-CM | POA: Diagnosis not present

## 2018-12-25 DIAGNOSIS — E039 Hypothyroidism, unspecified: Secondary | ICD-10-CM | POA: Diagnosis not present

## 2018-12-26 MED FILL — LEVOTHYROXINE 75 MCG TABLET: 75 | 30 days supply | Qty: 30 | Fill #0

## 2019-01-20 MED FILL — LEVOTHYROXINE 75 MCG TABLET: 75 | 30 days supply | Qty: 30 | Fill #1 | Status: TO

## 2019-02-06 DIAGNOSIS — E039 Hypothyroidism, unspecified: Secondary | ICD-10-CM | POA: Diagnosis not present

## 2019-02-18 MED FILL — LEVOTHYROXINE 75 MCG TABLET: 75 | 30 days supply | Qty: 30 | Fill #0

## 2019-03-24 MED FILL — LEVOTHYROXINE 75 MCG TABLET: 75 | 90 days supply | Qty: 90 | Fill #0

## 2019-04-15 DIAGNOSIS — H5213 Myopia, bilateral: Secondary | ICD-10-CM | POA: Diagnosis not present

## 2019-04-15 MED FILL — OLOPATADINE HCL 0.2% EYE DR: 0.2 | 25 days supply | Qty: 3 | Fill #0

## 2019-06-24 DIAGNOSIS — E039 Hypothyroidism, unspecified: Secondary | ICD-10-CM | POA: Diagnosis not present

## 2019-06-24 LAB — BASIC METABOLIC PANEL
BUN: 11 (ref 4–21)
Creatinine: 0.9 (ref 0.5–1.1)
Glucose: 92
Potassium: 4.5 (ref 3.4–5.3)
Sodium: 139 (ref 137–147)

## 2019-06-24 LAB — HEPATIC FUNCTION PANEL
ALT: 14 (ref 7–35)
AST: 19 (ref 13–35)
Alkaline Phosphatase: 53 (ref 25–125)
Bilirubin, Total: 0.4

## 2019-06-24 LAB — TSH: TSH: 4.29 (ref 0.41–5.90)

## 2019-06-30 DIAGNOSIS — E042 Nontoxic multinodular goiter: Secondary | ICD-10-CM | POA: Diagnosis not present

## 2019-06-30 DIAGNOSIS — Z7189 Other specified counseling: Secondary | ICD-10-CM | POA: Diagnosis not present

## 2019-06-30 DIAGNOSIS — E039 Hypothyroidism, unspecified: Secondary | ICD-10-CM | POA: Diagnosis not present

## 2019-06-30 DIAGNOSIS — Z6824 Body mass index (BMI) 24.0-24.9, adult: Secondary | ICD-10-CM | POA: Diagnosis not present

## 2019-06-30 MED FILL — LEVOTHYROXINE 75 MCG TABLET: 75 | 90 days supply | Qty: 90 | Fill #0

## 2019-07-02 ENCOUNTER — Encounter: Payer: Self-pay | Admitting: Internal Medicine

## 2019-07-02 NOTE — Progress Notes (Signed)
Abstracted and sent to scan  

## 2019-07-24 ENCOUNTER — Other Ambulatory Visit: Payer: Self-pay

## 2019-07-24 ENCOUNTER — Encounter: Payer: 59 | Admitting: Internal Medicine

## 2019-07-24 ENCOUNTER — Encounter: Payer: Self-pay | Admitting: Internal Medicine

## 2019-07-24 ENCOUNTER — Ambulatory Visit (INDEPENDENT_AMBULATORY_CARE_PROVIDER_SITE_OTHER): Payer: 59 | Admitting: Internal Medicine

## 2019-07-24 VITALS — BP 110/80 | HR 75 | Temp 98.3°F | Ht 66.0 in | Wt 151.0 lb

## 2019-07-24 DIAGNOSIS — Z8349 Family history of other endocrine, nutritional and metabolic diseases: Secondary | ICD-10-CM

## 2019-07-24 DIAGNOSIS — Z0000 Encounter for general adult medical examination without abnormal findings: Secondary | ICD-10-CM | POA: Diagnosis not present

## 2019-07-24 MED ORDER — CITALOPRAM HYDROBROMIDE 20 MG PO TABS: 20.0000 mg | ORAL_TABLET | Freq: Every day | ORAL | 1 refills | Status: DC

## 2019-07-24 MED ORDER — ALPRAZOLAM 0.25 MG PO TABS: 0.2500 mg | ORAL_TABLET | Freq: Every day | ORAL | 2 refills | Status: DC | PRN

## 2019-07-24 MED FILL — CITALOPRAM HBR 20 MG TABLET: 20 | 90 days supply | Qty: 90 | Fill #0

## 2019-07-24 MED FILL — ALPRAZolam 0.25 MG TABS: 0.25 | 30 days supply | Qty: 30 | Fill #0

## 2019-07-24 NOTE — Progress Notes (Signed)
   Subjective:   Patient ID: Allison Hicks, female    DOB: 01-Feb-1986, 33 y.o.   MRN: [id number]  HPI The patient is a 33 YO female coming in for physical. Some increasing anxiety/depression due to pandemic.   PMH, [facility], social history reviewed and updated  Review of Systems  Constitutional: Negative.   HENT: Negative.   Eyes: Negative.   Respiratory: Negative for cough, chest tightness and shortness of breath.   Cardiovascular: Negative for chest pain, palpitations and leg swelling.  Gastrointestinal: Negative for abdominal distention, abdominal pain, constipation, diarrhea, nausea and vomiting.  Musculoskeletal: Negative.   Skin: Negative.   Neurological: Negative.   Psychiatric/Behavioral: Positive for decreased concentration and dysphoric mood. The patient is nervous/anxious.     Objective:  Physical Exam Constitutional:      Appearance: She is well-developed.  HENT:     Head: Normocephalic and atraumatic.  Neck:     Musculoskeletal: Normal range of motion.  Cardiovascular:     Rate and Rhythm: Normal rate and regular rhythm.  Pulmonary:     Effort: Pulmonary effort is normal. No respiratory distress.     Breath sounds: Normal breath sounds. No wheezing or rales.  Abdominal:     General: Bowel sounds are normal. There is no distension.     Palpations: Abdomen is soft.     Tenderness: There is no abdominal tenderness. There is no rebound.  Skin:    General: Skin is warm and dry.  Neurological:     Mental Status: She is alert and oriented to person, place, and time.     Coordination: Coordination normal.     Vitals:   07/24/19 1436  BP: 110/80  Pulse: 75  Temp: 98.3 F (36.8 C)  TempSrc: Oral  SpO2: 99%  Weight: 151 lb (68.5 kg)  Height: 5\' 6"  (1.676 m)    Assessment & Plan:

## 2019-07-24 NOTE — Patient Instructions (Addendum)
Health Maintenance, Female Adopting a healthy lifestyle and getting preventive care are important in promoting health and wellness. Ask your health care provider about:  The right schedule for you to have regular tests and exams.  Things you can do on your own to prevent diseases and keep yourself healthy. What should I know about diet, weight, and exercise? Eat a healthy diet   Eat a diet that includes plenty of vegetables, fruits, low-fat dairy products, and lean protein.  Do not eat a lot of foods that are high in solid fats, added sugars, or sodium. Maintain a healthy weight Body mass index (BMI) is used to identify weight problems. It estimates body fat based on height and weight. Your health care provider can help determine your BMI and help you achieve or maintain a healthy weight. Get regular exercise Get regular exercise. This is one of the most important things you can do for your health. Most adults should:  Exercise for at least 150 minutes each week. The exercise should increase your heart rate and make you sweat (moderate-intensity exercise).  Do strengthening exercises at least twice a week. This is in addition to the moderate-intensity exercise.  Spend less time sitting. Even light physical activity can be beneficial. Watch cholesterol and blood lipids Have your blood tested for lipids and cholesterol at 34 years of age, then have this test every 5 years. Have your cholesterol levels checked more often if:  Your lipid or cholesterol levels are high.  You are older than 34 years of age.  You are at high risk for heart disease. What should I know about cancer screening? Depending on your health history and family history, you may need to have cancer screening at various ages. This may include screening for:  Breast cancer.  Cervical cancer.  Colorectal cancer.  Skin cancer.  Lung cancer. What should I know about heart disease, diabetes, and high blood  pressure? Blood pressure and heart disease  High blood pressure causes heart disease and increases the risk of stroke. This is more likely to develop in people who have high blood pressure readings, are of African descent, or are overweight.  Have your blood pressure checked: ? Every 3-5 years if you are 18-39 years of age. ? Every year if you are 40 years old or older. Diabetes Have regular diabetes screenings. This checks your fasting blood sugar level. Have the screening done:  Once every three years after age 40 if you are at a normal weight and have a low risk for diabetes.  More often and at a younger age if you are overweight or have a high risk for diabetes. What should I know about preventing infection? Hepatitis B If you have a higher risk for hepatitis B, you should be screened for this virus. Talk with your health care provider to find out if you are at risk for hepatitis B infection. Hepatitis C Testing is recommended for:  Everyone born from 1945 through 1965.  Anyone with known risk factors for hepatitis C. Sexually transmitted infections (STIs)  Get screened for STIs, including gonorrhea and chlamydia, if: ? You are sexually active and are younger than 34 years of age. ? You are older than 34 years of age and your health care provider tells you that you are at risk for this type of infection. ? Your sexual activity has changed since you were last screened, and you are at increased risk for chlamydia or gonorrhea. Ask your health care provider if   you are at risk.  Ask your health care provider about whether you are at high risk for HIV. Your health care provider may recommend a prescription medicine to help prevent HIV infection. If you choose to take medicine to prevent HIV, you should first get tested for HIV. You should then be tested every 3 months for as long as you are taking the medicine. Pregnancy  If you are about to stop having your period (premenopausal) and  you may become pregnant, seek counseling before you get pregnant.  Take 400 to 800 micrograms (mcg) of folic acid every day if you become pregnant.  Ask for birth control (contraception) if you want to prevent pregnancy. Osteoporosis and menopause Osteoporosis is a disease in which the bones lose minerals and strength with aging. This can result in bone fractures. If you are 65 years old or older, or if you are at risk for osteoporosis and fractures, ask your health care provider if you should:  Be screened for bone loss.  Take a calcium or vitamin D supplement to lower your risk of fractures.  Be given hormone replacement therapy (HRT) to treat symptoms of menopause. Follow these instructions at home: Lifestyle  Do not use any products that contain nicotine or tobacco, such as cigarettes, e-cigarettes, and chewing tobacco. If you need help quitting, ask your health care provider.  Do not use street drugs.  Do not share needles.  Ask your health care provider for help if you need support or information about quitting drugs. Alcohol use  Do not drink alcohol if: ? Your health care provider tells you not to drink. ? You are pregnant, may be pregnant, or are planning to become pregnant.  If you drink alcohol: ? Limit how much you use to 0-1 drink a day. ? Limit intake if you are breastfeeding.  Be aware of how much alcohol is in your drink. In the [place] one drink equals one 12 oz bottle of beer (355 mL), one 5 oz glass of wine (148 mL), or one 1 oz glass of hard liquor (44 mL). General instructions  Schedule regular health, dental, and eye exams.  Stay current with your vaccines.  Tell your health care provider if: ? You often feel depressed. ? You have ever been abused or do not feel safe at home. Summary  Adopting a healthy lifestyle and getting preventive care are important in promoting health and wellness.  Follow your health care provider's instructions about healthy  diet, exercising, and getting tested or screened for diseases.  Follow your health care provider's instructions on monitoring your cholesterol and blood pressure. This information is not intended to replace advice given to you by your health care provider. Make sure you discuss any questions you have with your health care provider. Document Released: 05/08/2011 Document Revised: 10/16/2018 Document Reviewed: 10/16/2018 Elsevier Patient Education  2020 [employer]  

## 2019-07-25 ENCOUNTER — Encounter: Payer: Self-pay | Admitting: Internal Medicine

## 2019-07-25 DIAGNOSIS — Z0000 Encounter for general adult medical examination without abnormal findings: Secondary | ICD-10-CM | POA: Insufficient documentation

## 2019-07-25 MED ORDER — ONDANSETRON HCL 4 MG PO TABS: 4.0000 mg | ORAL_TABLET | Freq: Three times a day (TID) | ORAL | 0 refills | Status: DC | PRN

## 2019-07-25 MED FILL — ONDANSETRON HCL 4 MG TABLET: 4 | 7 days supply | Qty: 20 | Fill #0

## 2019-07-25 NOTE — Assessment & Plan Note (Signed)
Taking synthroid for suppression with endo.

## 2019-07-25 NOTE — Assessment & Plan Note (Signed)
Flu shot gets at work. Tetanus up to date. Pap smear up to date. Counseled about sun safety and mole surveillance. Counseled about the dangers of distracted driving. Given 10 year screening recommendations.

## 2019-09-01 DIAGNOSIS — E039 Hypothyroidism, unspecified: Secondary | ICD-10-CM | POA: Diagnosis not present

## 2019-09-15 ENCOUNTER — Encounter: Payer: Self-pay | Admitting: Internal Medicine

## 2019-09-15 MED ORDER — BUPROPION HCL ER (XL) 150 MG PO TB24: 150.0000 mg | ORAL_TABLET | Freq: Every day | ORAL | 1 refills | Status: DC

## 2019-09-15 MED FILL — buPROPion HCL ER (XL) 150 M: 150 | 90 days supply | Qty: 90 | Fill #0

## 2019-09-30 MED FILL — LEVOTHYROXINE 75 MCG TABLET: 75 | 90 days supply | Qty: 90 | Fill #1

## 2019-10-29 ENCOUNTER — Telehealth: Payer: 59 | Admitting: Family

## 2019-10-29 DIAGNOSIS — J069 Acute upper respiratory infection, unspecified: Secondary | ICD-10-CM

## 2019-10-29 MED ORDER — BENZONATATE 100 MG PO CAPS: 100.0000 mg | ORAL_CAPSULE | Freq: Three times a day (TID) | ORAL | 0 refills | Status: DC | PRN

## 2019-10-29 MED ORDER — PROMETHAZINE-DM 6.25-15 MG/5ML PO SYRP: 5.0000 mL | ORAL_SOLUTION | Freq: Three times a day (TID) | ORAL | 0 refills | Status: DC | PRN

## 2019-10-29 MED ORDER — FLUTICASONE PROPIONATE 50 MCG/ACT NA SUSP: 2.0000 | Freq: Every day | NASAL | 6 refills | Status: DC

## 2019-10-29 NOTE — Progress Notes (Signed)
We are sorry you are not feeling well.  Here is how we plan to help!  Based on what you have shared with me, it looks like you may have a viral upper respiratory infection.  Upper respiratory infections are caused by a large number of viruses; however, rhinovirus is the most common cause.   Symptoms vary from person to person, with common symptoms including sore throat, cough, fatigue or lack of energy and feeling of general discomfort.  A low-grade fever of up to 100.4 may present, but is often uncommon.  Symptoms vary however, and are closely related to a person's age or underlying illnesses.  The most common symptoms associated with an upper respiratory infection are nasal discharge or congestion, cough, sneezing, headache and pressure in the ears and face.  These symptoms usually persist for about 3 to 10 days, but can last up to 2 weeks.  It is important to know that upper respiratory infections do not cause serious illness or complications in most cases.    Upper respiratory infections can be transmitted from person to person, with the most common method of transmission being a person's hands.  The virus is able to live on the skin and can infect other persons for up to 2 hours after direct contact.  Also, these can be transmitted when someone coughs or sneezes; thus, it is important to cover the mouth to reduce this risk.  To keep the spread of the illness at [facility], good hand hygiene is very important.  This is an infection that is most likely caused by a virus. There are no specific treatments other than to help you with the symptoms until the infection runs its course.  We are sorry you are not feeling well.  Here is how we plan to help!   For nasal congestion, you may use an oral decongestants such as Mucinex D or if you have glaucoma or high blood pressure use plain Mucinex.  Saline nasal spray or nasal drops can help and can safely be used as often as needed for congestion.  For your congestion,  I have prescribed Fluticasone nasal spray one spray in each nostril twice a day  If you do not have a history of heart disease, hypertension, diabetes or thyroid disease, prostate/bladder issues or glaucoma, you may also use Sudafed to treat nasal congestion.  It is highly recommended that you consult with a pharmacist or your primary care physician to ensure this medication is safe for you to take.     If you have a cough, you may use cough suppressants such as Delsym and Robitussin.  If you have glaucoma or high blood pressure, you can also use Coricidin HBP.   For cough I have prescribed for you A prescription cough medication called Tessalon Perles 100 mg. You may take 1-2 capsules every 8 hours as needed for cough  And promethazine-DM cough syrup that you can take three times a day as needed.   If you have a sore or scratchy throat, use a saltwater gargle-  to  teaspoon of salt dissolved in a 4-ounce to 8-ounce glass of warm water.  Gargle the solution for approximately 15-30 seconds and then spit.  It is important not to swallow the solution.  You can also use throat lozenges/cough drops and Chloraseptic spray to help with throat pain or discomfort.  Warm or cold liquids can also be helpful in relieving throat pain.  For headache, pain or general discomfort, you can use Ibuprofen  or Tylenol as directed.   Some authorities believe that zinc sprays or the use of Echinacea may shorten the course of your symptoms.   HOME CARE . Only take medications as instructed by your medical team. . Be sure to drink plenty of fluids. Water is fine as well as fruit juices, sodas and electrolyte beverages. You may want to stay away from caffeine or alcohol. If you are nauseated, try taking small sips of liquids. How do you know if you are getting enough fluid? Your urine should be a pale yellow or almost colorless. . Get rest. . Taking a steamy shower or using a humidifier may help nasal congestion and ease  sore throat pain. You can place a towel over your head and breathe in the steam from hot water coming from a faucet. . Using a saline nasal spray works much the same way. . Cough drops, hard candies and sore throat lozenges may ease your cough. . Avoid close contacts especially the very young and the elderly . Cover your mouth if you cough or sneeze . Always remember to wash your hands.   GET HELP RIGHT AWAY IF: . You develop worsening fever. . If your symptoms do not improve within 10 days . You develop yellow or green discharge from your nose over 3 days. . You have coughing fits . You develop a severe head ache or visual changes. . You develop shortness of breath, difficulty breathing or start having chest pain . Your symptoms persist after you have completed your treatment plan  MAKE SURE YOU   Understand these instructions.  Will watch your condition.  Will get help right away if you are not doing well or get worse.  Your e-visit answers were reviewed by a board certified advanced clinical practitioner to complete your personal care plan. Depending upon the condition, your plan could have included both over the counter or prescription medications. Please review your pharmacy choice. If there is a problem, you may call our nursing hot line at and have the prescription routed to another pharmacy. Your safety is important to [place]. If you have drug allergies check your prescription carefully.   You can use MyChart to ask questions about today's visit, request a non-urgent call back, or ask for a work or school excuse for 24 hours related to this e-Visit. If it has been greater than 24 hours you will need to follow up with your provider, or enter a new e-Visit to address those concerns. You will get an e-mail in the next two days asking about your experience.  I hope that your e-visit has been valuable and will speed your recovery. Thank you for using e-visits.     Approximately 5  minutes was spent documenting and reviewing patient's chart.

## 2019-11-18 MED FILL — ALPRAZolam 0.25 MG TABS: 0.25 | 30 days supply | Qty: 30 | Fill #1

## 2019-12-12 ENCOUNTER — Other Ambulatory Visit: Payer: Self-pay | Admitting: Internal Medicine

## 2019-12-12 DIAGNOSIS — E042 Nontoxic multinodular goiter: Secondary | ICD-10-CM

## 2019-12-26 ENCOUNTER — Ambulatory Visit
Admission: RE | Admit: 2019-12-26 | Discharge: 2019-12-26 | Disposition: A | Discharge status: Home / Self Care | Payer: 59 | Source: Ambulatory Visit | Attending: Internal Medicine | Admitting: Internal Medicine

## 2019-12-26 DIAGNOSIS — E041 Nontoxic single thyroid nodule: Secondary | ICD-10-CM | POA: Diagnosis not present

## 2019-12-26 DIAGNOSIS — E042 Nontoxic multinodular goiter: Secondary | ICD-10-CM

## 2019-12-30 MED FILL — LEVOTHYROXINE SODIUM 75 MCG: 75 | 90 days supply | Qty: 90 | Fill #2

## 2020-01-14 MED FILL — ALPRAZolam 0.25 MG TABS: 0.25 | 30 days supply | Qty: 30 | Fill #2

## 2020-01-22 DIAGNOSIS — E039 Hypothyroidism, unspecified: Secondary | ICD-10-CM | POA: Diagnosis not present

## 2020-01-29 ENCOUNTER — Other Ambulatory Visit (HOSPITAL_COMMUNITY): Payer: Self-pay | Admitting: Internal Medicine

## 2020-01-29 DIAGNOSIS — Z7189 Other specified counseling: Secondary | ICD-10-CM | POA: Diagnosis not present

## 2020-01-29 DIAGNOSIS — E042 Nontoxic multinodular goiter: Secondary | ICD-10-CM | POA: Diagnosis not present

## 2020-01-29 DIAGNOSIS — Z6825 Body mass index (BMI) 25.0-25.9, adult: Secondary | ICD-10-CM | POA: Diagnosis not present

## 2020-01-29 DIAGNOSIS — E039 Hypothyroidism, unspecified: Secondary | ICD-10-CM | POA: Diagnosis not present

## 2020-01-29 DIAGNOSIS — L74519 Primary focal hyperhidrosis, unspecified: Secondary | ICD-10-CM | POA: Diagnosis not present

## 2020-02-03 MED FILL — DRYSOL DAB-O-MATIC SOLUTION: 20 | 30 days supply | Qty: 70 | Fill #0

## 2020-04-01 MED FILL — LEVOTHYROXINE SODIUM 75 MCG: 75 | 90 days supply | Qty: 90 | Fill #0

## 2020-05-13 DIAGNOSIS — H5213 Myopia, bilateral: Secondary | ICD-10-CM | POA: Diagnosis not present

## 2020-05-13 DIAGNOSIS — H52221 Regular astigmatism, right eye: Secondary | ICD-10-CM | POA: Diagnosis not present

## 2020-06-30 MED FILL — LEVOTHYROXINE 75 MCG TABLET: 75 | 90 days supply | Qty: 90 | Fill #1

## 2020-09-29 MED FILL — LEVOTHYROXINE 75 MCG TABLET: 75 | 90 days supply | Qty: 90 | Fill #2

## 2020-10-05 ENCOUNTER — Other Ambulatory Visit: Payer: Self-pay | Admitting: Physician Assistant

## 2020-10-05 ENCOUNTER — Telehealth: Payer: 59 | Admitting: Physician Assistant

## 2020-10-05 DIAGNOSIS — T300 Burn of unspecified body region, unspecified degree: Secondary | ICD-10-CM | POA: Diagnosis not present

## 2020-10-05 MED ORDER — MUPIROCIN 2 % EX OINT: 1.0000 "application " | TOPICAL_OINTMENT | Freq: Three times a day (TID) | CUTANEOUS | 1 refills | Status: DC

## 2020-10-05 MED FILL — MUPIROCIN 2% OINTMENT: 2 | 7 days supply | Qty: 22 | Fill #0
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
## 2020-10-15 IMAGING — US US THYROID
1 series · 13 of 25 positions shown · non-contrast
Comparison: None.

CLINICAL DATA: Goiter.  Short of breath.

EXAM:
THYROID ULTRASOUND
TECHNIQUE: Ultrasound examination of the thyroid gland and adjacent soft
tissues was performed.

[Series 1: us thyroid · 0.06mm/px · 13 of 55 slices shown]
[im 1/55]
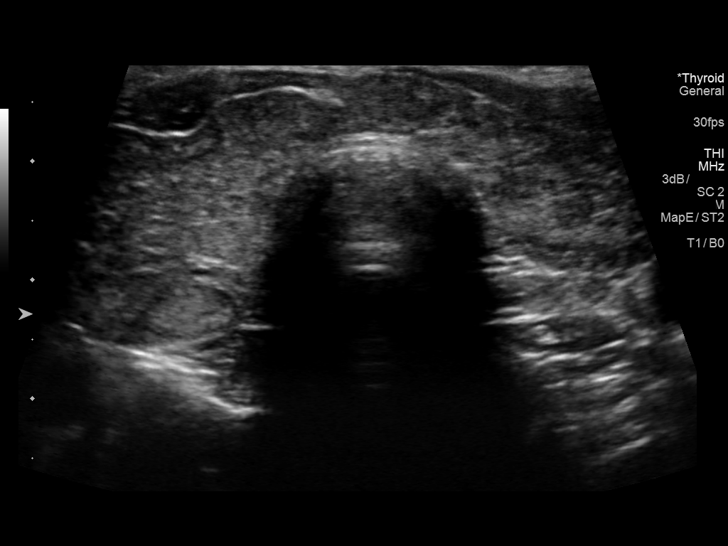
[im 5/55]
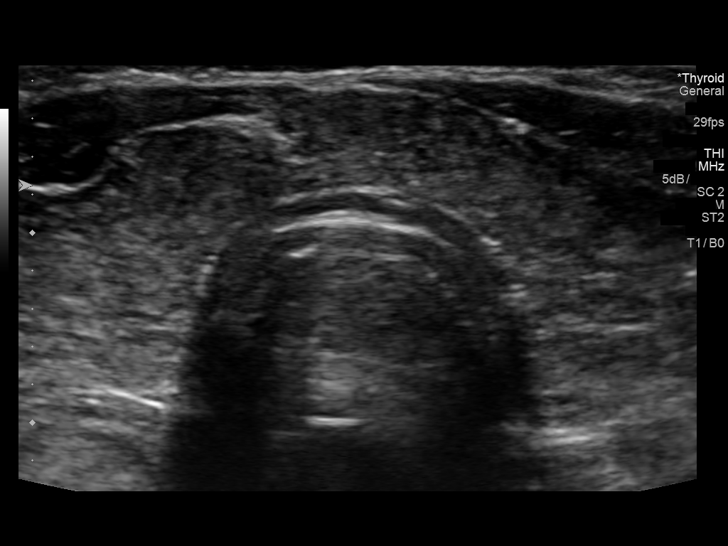
[im 10/55]
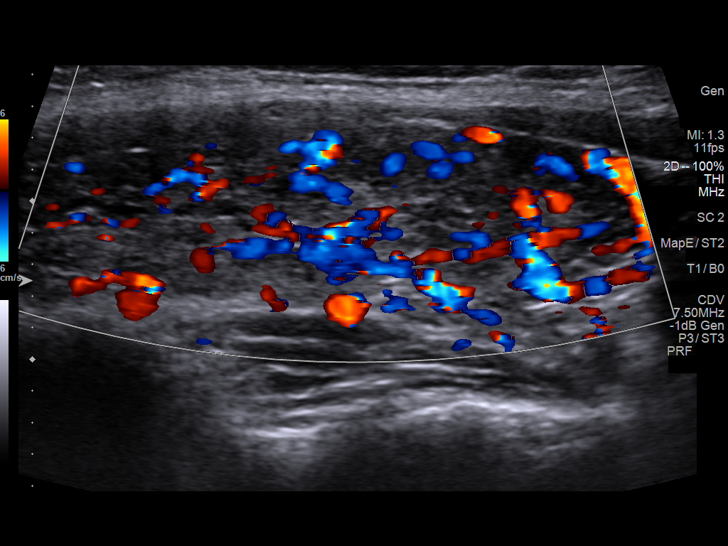
[im 14/55]
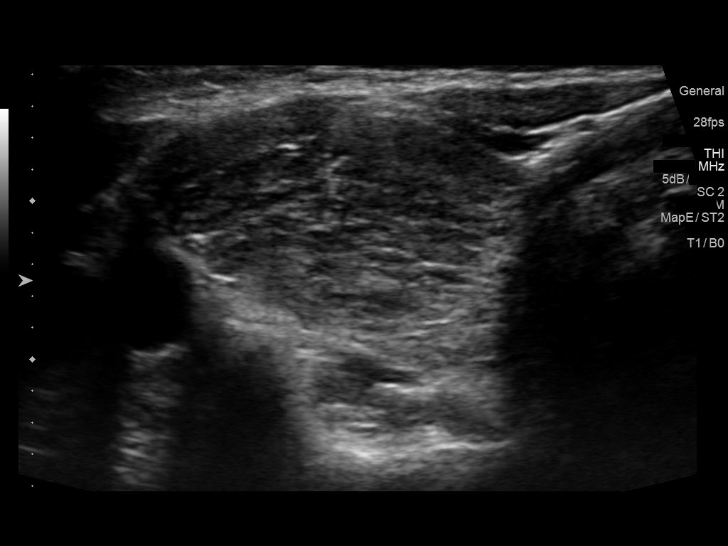
[im 19/55]
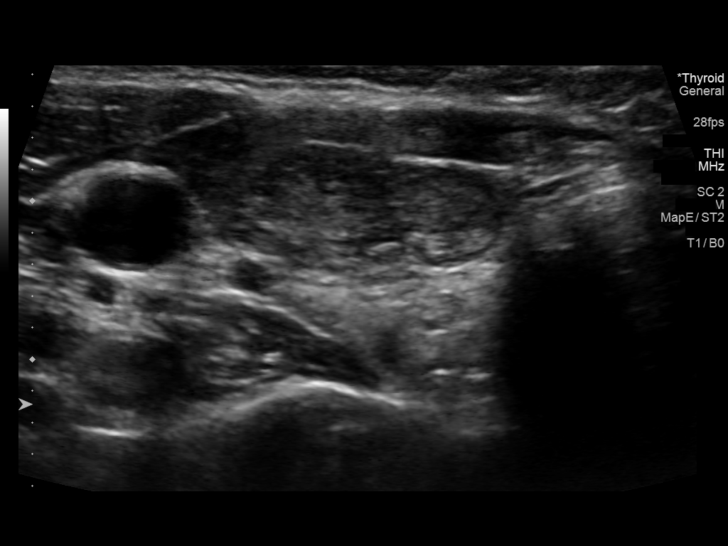
[im 23/55]
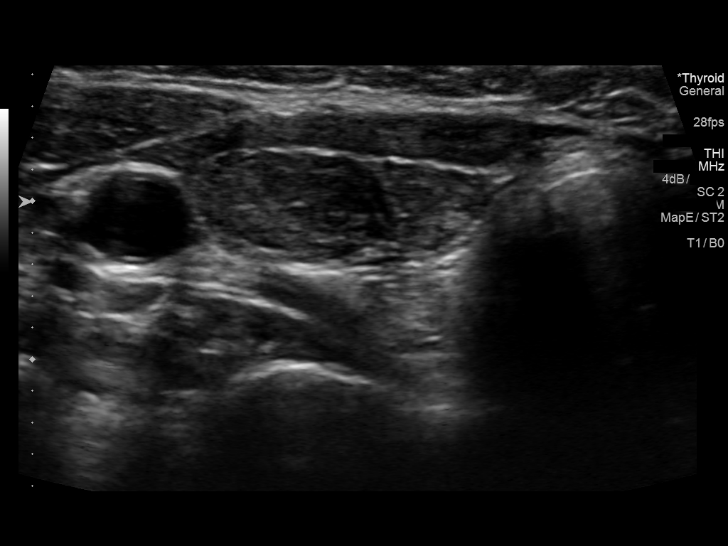
[im 28/55]
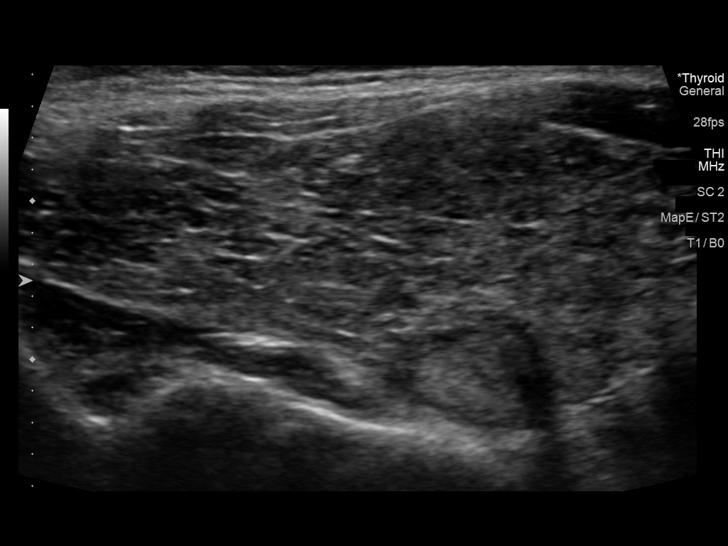
[im 32/55]
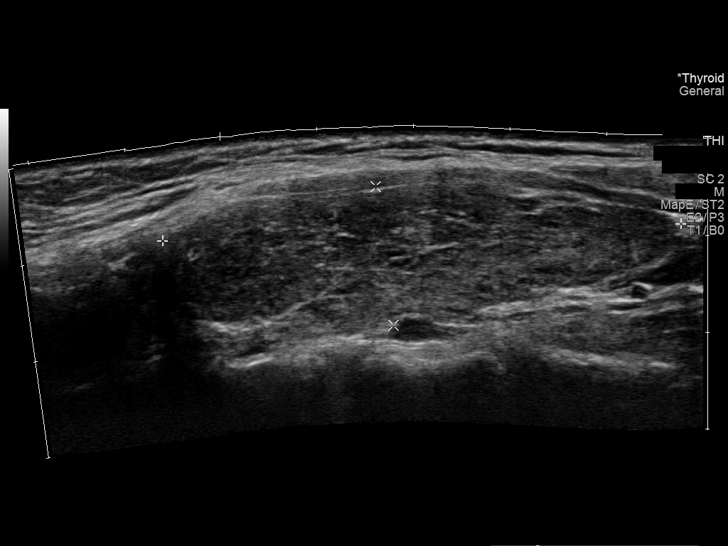
[im 37/55]
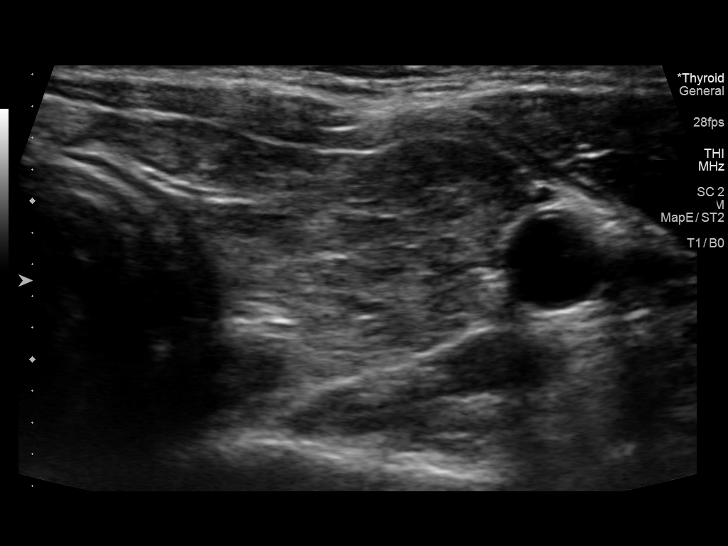
[im 41/55]
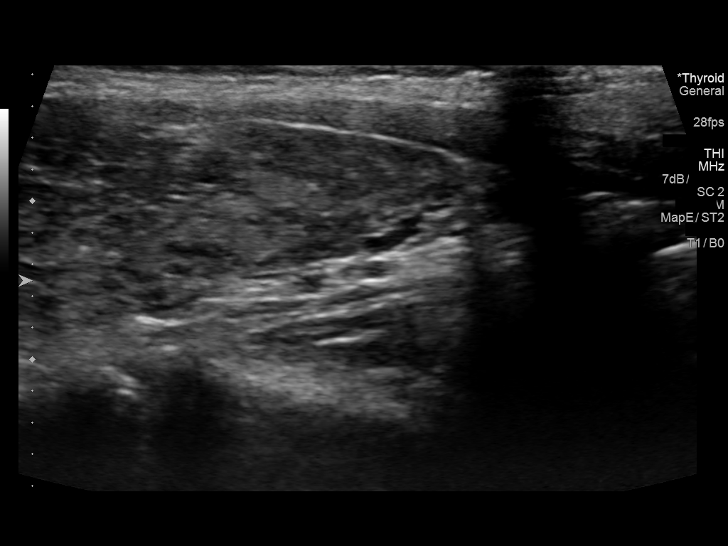
[im 46/55]
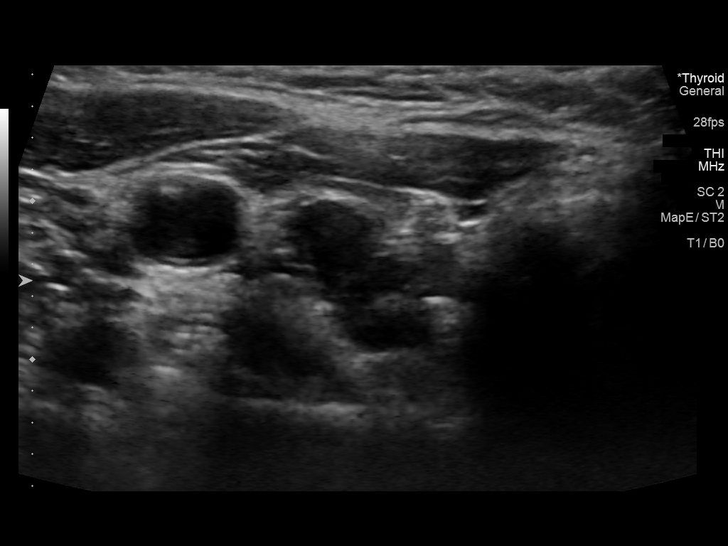
[im 50/55]
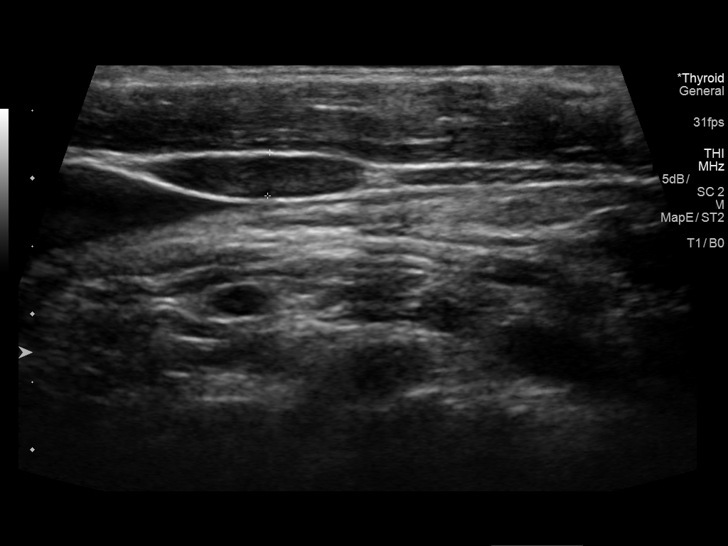
[im 55/55]
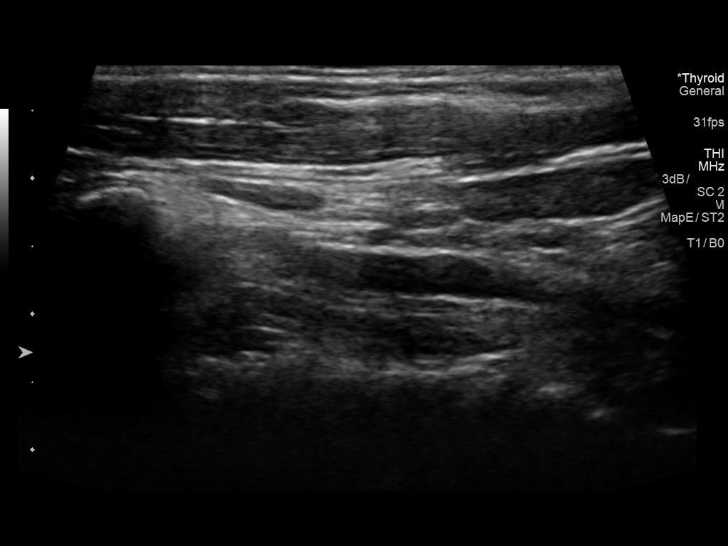

[13 of 25 positions shown; findings below may reference images not displayed]

FINDINGS: Parenchymal Echotexture: Markedly heterogenous

Isthmus: 0.6 cm

Right lobe: 6.3 x 1.5 x 2.5 cm

Left lobe: 5.3 x 1.4 x 2.1 cm

_________________________________________________________

Estimated total number of nodules >/= 1 cm: 2

Number of spongiform nodules >/=  2 cm not described below (TR1): 0

Number of mixed cystic and solid nodules >/= 1.5 cm not described
below (TR2): 0

_________________________________________________________

Nodule 1 in the lower pole is 1.0 cm and is isoechoic. It does not
meet criteria for biopsy nor follow-up.

Nodule versus pseudo nodule # 2:

Location: Right; Inferior

Maximum size: 1.6 cm; Other 2 dimensions: 1.4 x 0.7 cm

Composition: solid/almost completely solid (2)

Echogenicity: isoechoic (1)

Shape: not taller-than-wide (0)

Margins: ill-defined (0)

Echogenic foci: none (0)

ACR TI-RADS total points: 3.

ACR TI-RADS risk category: TR3 (3 points).

ACR TI-RADS recommendations:

*Given size (>/= 1.5 - 2.4 cm) and appearance, a follow-up
ultrasound in 1 year should be considered based on TI-RADS criteria.
It is difficult to determine if this is within the gland or
posterior to the lower pole of the right lobe and external to the
gland.

_________________________________________________________
IMPRESSION: Right nodule 1 does not meet criteria for biopsy nor follow-up.

Right nodule versus pseudo nodule 2 meets criteria for annual
follow-up. It is difficult to determine if this is within the gland
or external to the gland and posterior to the lower pole such as a
parathyroid mass.

The above is in keeping with the ACR TI-RADS recommendations - [HOSPITAL] 1376;[DATE].

## 2020-12-21 MED FILL — LEVOTHYROXINE 75 MCG TABLET: 75 | 90 days supply | Qty: 90 | Fill #3

## 2021-02-21 ENCOUNTER — Telehealth (INDEPENDENT_AMBULATORY_CARE_PROVIDER_SITE_OTHER): Payer: 59 | Admitting: Family

## 2021-02-21 ENCOUNTER — Other Ambulatory Visit: Payer: Self-pay

## 2021-02-21 DIAGNOSIS — J069 Acute upper respiratory infection, unspecified: Secondary | ICD-10-CM | POA: Diagnosis not present

## 2021-02-21 MED ORDER — HYDROCOD POLST-CPM POLST ER 10-8 MG/5ML PO SUER
5.0000 mL | Freq: Two times a day (BID) | ORAL | 0 refills | Status: DC | PRN
  Filled 2021-02-21: qty 115, 12d supply, fill #0

## 2021-02-21 NOTE — Progress Notes (Signed)
Allison Hicks is a 35 y.o. female with the following history as recorded in EpicCare:  Patient Active Problem List   Diagnosis Date Noted  . Routine general medical examination at a health care facility 07/25/2019  . Migraine 02/01/2018  . Family history of thyroid disease 02/01/2018    Current Outpatient Medications  Medication Sig Dispense Refill  . ALPRAZolam (XANAX) 0.25 MG tablet Take 1 tablet (0.25 mg total) by mouth daily as needed for anxiety. 30 tablet 2  . BIOTIN 5000 PO Take by mouth.    . cetirizine (ZYRTEC) 10 MG tablet Take 10 mg by mouth daily as needed for allergies.    . chlorpheniramine-HYDROcodone (TUSSIONEX PENNKINETIC ER) 10-8 MG/5ML SUER Take 5 mLs by mouth every 12 (twelve) hours as needed for cough. 115 mL 0  . levothyroxine (SYNTHROID) 75 MCG tablet     . acetaminophen (TYLENOL) 500 MG tablet Take 500 mg by mouth every 6 (six) hours as needed for pain.    . calcium carbonate (TUMS - DOSED IN MG ELEMENTAL CALCIUM) 500 MG chewable tablet Chew 2 tablets by mouth 4 (four) times daily as needed for indigestion or heartburn. (Patient not taking: Reported on 02/21/2021)    . fluticasone (FLONASE) 50 MCG/ACT nasal spray Place 2 sprays into both nostrils daily. (Patient not taking: Reported on 02/21/2021) 16 g 6  . levonorgestrel (MIRENA) 20 MCG/24HR IUD 1 each by Intrauterine route once.    Marland Kitchen VITAMIN D PO Take 5,000 Units by mouth.      No current facility-administered medications for this visit.    Allergies: Amoxicillin-pot clavulanate, Fluconazole, Penicillins, and Sulfa antibiotics  Past Medical History:  Diagnosis Date  . Allergy   . Asthma   . Headache   . Migraines     Past Surgical History:  Procedure Laterality Date  . WISDOM TOOTH EXTRACTION      Family History  Problem Relation Age of Onset  . Thyroid disease Sister   . Diabetes Maternal Aunt   . Thyroid disease Maternal Grandmother   . Diabetes Maternal Grandfather   . Hyperlipidemia Paternal  Grandfather   . Parkinson's disease Paternal Grandfather   . Thyroid disease Maternal Aunt   . Other Brother        brain tumor    Social History   Tobacco Use  . Smoking status: Never Smoker  . Smokeless tobacco: Never Used  Substance Use Topics  . Alcohol use: No    Comment: Social    Subjective:   I connected with Allison Hicks on 02/21/21 at  9:00 AM EDT by a video enabled telemedicine application and verified that I am speaking with the correct person using two identifiers.   I discussed the limitations of evaluation and management by telemedicine and the availability of in person appointments. The patient expressed understanding and agreed to proceed. Provider in office/ patient is at home; provider and patient are only 2 people on video call.   Started Friday evening with cough/ congestion; was initially running a fever- up to 101/ fever has now subsided; son was sick with similar symptoms initially and he is now better; 2nd child in home now has similar symptoms; limited relief with Tylenol/ Ibuprofen/ Mucinex D; cough is most problematic at night; scheduled for COVID test later this afternoon through her employer; son's COVID test was negative;     Objective:  There were no vitals filed for this visit.  General: Well developed, well nourished, in no acute distress  Head: Normocephalic and atraumatic  Lungs: Respirations unlabored; Neurologic: Alert and oriented; speech intact; face symmetrical;   Assessment:  1. Viral URI with cough     Plan:  Symptomatic treatment discussed; Rx for Tussionex Cough Syrup 1 tsp po bid prn; keep scheduled COVID test for later today; increase fluids, rest and follow-up worse, no better.    No follow-ups on file.  No orders of the defined types were placed in this encounter.   Requested Prescriptions   Signed Prescriptions Disp Refills  . chlorpheniramine-HYDROcodone (TUSSIONEX PENNKINETIC ER) 10-8 MG/5ML SUER 115 mL 0    Sig:  Take 5 mLs by mouth every 12 (twelve) hours as needed for cough.

## 2021-03-01 ENCOUNTER — Other Ambulatory Visit: Payer: Self-pay

## 2021-03-01 ENCOUNTER — Ambulatory Visit: Payer: 59 | Admitting: Internal Medicine

## 2021-03-01 ENCOUNTER — Other Ambulatory Visit (HOSPITAL_COMMUNITY): Payer: Self-pay

## 2021-03-01 ENCOUNTER — Encounter: Payer: Self-pay | Admitting: Internal Medicine

## 2021-03-01 DIAGNOSIS — R059 Cough, unspecified: Secondary | ICD-10-CM | POA: Diagnosis not present

## 2021-03-01 MED ORDER — PREDNISONE 20 MG PO TABS
40.0000 mg | ORAL_TABLET | Freq: Every day | ORAL | 0 refills | Status: DC
  Filled 2021-03-01: qty 10, 5d supply, fill #0

## 2021-03-01 MED ORDER — BENZONATATE 200 MG PO CAPS
200.0000 mg | ORAL_CAPSULE | Freq: Three times a day (TID) | ORAL | 0 refills | Status: DC | PRN
  Filled 2021-03-01: qty 60, 20d supply, fill #0

## 2021-03-01 NOTE — Patient Instructions (Signed)
We have sent in prednisone to take 2 pills daily for 5 days then stop.  We have also sent in tessalon perles to use for cough up to 3 times a day.

## 2021-03-01 NOTE — Progress Notes (Signed)
   Subjective:   Patient ID: Allison Hicks, female    DOB: 1986/10/26, 35 y.o.   MRN: [id number]  HPI The patient is a 35 YO female coming in for persistent cough. She has had this for about 1-2 weeks. Had covid-19 PCR testing through work early on in symptoms which was negative. She has been taking tussionex at night time for cough which is helping. Trying mucinex and tylenol during the day which is not helping well. Denies fevers or chills. Does have fatigue fairly bad. Overall not improving.   Review of Systems  Constitutional: Positive for activity change and appetite change. Negative for chills, fatigue, fever and unexpected weight change.  HENT: Positive for congestion, postnasal drip and rhinorrhea. Negative for ear discharge, ear pain, sinus pressure, sinus pain, sneezing, sore throat, tinnitus, trouble swallowing and voice change.   Eyes: Negative.   Respiratory: Positive for cough. Negative for chest tightness, shortness of breath and wheezing.   Cardiovascular: Negative.   Gastrointestinal: Negative.   Musculoskeletal: Positive for myalgias.  Neurological: Negative.     Objective:  Physical Exam Constitutional:      Appearance: She is well-developed.  HENT:     Head: Normocephalic and atraumatic.     Comments: Oropharynx with redness and clear drainage, nose with swollen turbinates, TMs normal bilaterally.  Neck:     Thyroid: No thyromegaly.  Cardiovascular:     Rate and Rhythm: Normal rate and regular rhythm.  Pulmonary:     Effort: Pulmonary effort is normal. No respiratory distress.     Breath sounds: Normal breath sounds. No wheezing or rales.  Abdominal:     General: Bowel sounds are normal. There is no distension.     Palpations: Abdomen is soft.     Tenderness: There is no abdominal tenderness. There is no rebound.  Musculoskeletal:        General: Tenderness present.     Cervical back: Normal range of motion.  Lymphadenopathy:     Cervical: No cervical  adenopathy.  Skin:    General: Skin is warm and dry.  Neurological:     Mental Status: She is alert and oriented to person, place, and time.     Coordination: Coordination normal.     Vitals:   03/01/21 1552  BP: 118/70  Pulse: 69  Resp: 18  Temp: 99.3 F (37.4 C)  TempSrc: Oral  SpO2: 99%  Weight: 156 lb (70.8 kg)  Height: 5\' 6"  (1.676 m)    This visit occurred during the SARS-CoV-2 public health emergency.  Safety protocols were in place, including screening questions prior to the visit, additional usage of staff PPE, and extensive cleaning of exam room while observing appropriate contact time as indicated for disinfecting solutions.   Assessment & Plan:

## 2021-03-03 DIAGNOSIS — R059 Cough, unspecified: Secondary | ICD-10-CM | POA: Insufficient documentation

## 2021-03-03 NOTE — Assessment & Plan Note (Signed)
Suspect viral. Rx prednisone and tessalon perles. No sounds consistent with pneumonia or bronchitis on exam. This is post-viral cough.

## 2021-03-07 ENCOUNTER — Ambulatory Visit (INDEPENDENT_AMBULATORY_CARE_PROVIDER_SITE_OTHER): Payer: 59 | Admitting: Internal Medicine

## 2021-03-07 ENCOUNTER — Encounter: Payer: Self-pay | Admitting: Internal Medicine

## 2021-03-07 ENCOUNTER — Other Ambulatory Visit: Payer: Self-pay

## 2021-03-07 ENCOUNTER — Other Ambulatory Visit (HOSPITAL_COMMUNITY): Payer: Self-pay

## 2021-03-07 VITALS — BP 112/82 | HR 94 | Temp 98.7°F | Resp 18 | Ht 66.0 in | Wt 156.0 lb

## 2021-03-07 DIAGNOSIS — F418 Other specified anxiety disorders: Secondary | ICD-10-CM

## 2021-03-07 DIAGNOSIS — E063 Autoimmune thyroiditis: Secondary | ICD-10-CM | POA: Diagnosis not present

## 2021-03-07 DIAGNOSIS — Z0000 Encounter for general adult medical examination without abnormal findings: Secondary | ICD-10-CM

## 2021-03-07 DIAGNOSIS — D367 Benign neoplasm of other specified sites: Secondary | ICD-10-CM

## 2021-03-07 DIAGNOSIS — E663 Overweight: Secondary | ICD-10-CM | POA: Diagnosis not present

## 2021-03-07 MED ORDER — ALPRAZOLAM 0.25 MG PO TABS: 0.2500 mg | ORAL_TABLET | Freq: Every day | ORAL | 2 refills | Status: DC | PRN

## 2021-03-07 MED ORDER — PHENTERMINE HCL 37.5 MG PO CAPS
37.5000 mg | ORAL_CAPSULE | Freq: Every morning | ORAL | 0 refills | Status: DC
  Filled 2021-03-07: qty 30, 30d supply, fill #0

## 2021-03-07 MED ORDER — ALPRAZOLAM 0.25 MG PO TABS
0.2500 mg | ORAL_TABLET | Freq: Every day | ORAL | 2 refills | Status: DC | PRN
  Filled 2021-03-07: qty 30, 30d supply, fill #0
  Filled 2021-06-30: qty 30, 30d supply, fill #1
  Filled 2021-07-29: qty 30, 30d supply, fill #2

## 2021-03-07 MED ORDER — PHENTERMINE HCL 37.5 MG PO CAPS: 37.5000 mg | ORAL_CAPSULE | ORAL | 0 refills | Status: DC

## 2021-03-07 NOTE — Patient Instructions (Addendum)
Come back next week for the labs. We will get you in with a surgeon to talk to them about removing the cyst on the arm.  We have sent in the adipex (phentermine) for 1 month for the weight.  Health Maintenance, Female Adopting a healthy lifestyle and getting preventive care are important in promoting health and wellness. Ask your health care provider about:  The right schedule for you to have regular tests and exams.  Things you can do on your own to prevent diseases and keep yourself healthy. What should I know about diet, weight, and exercise? Eat a healthy diet  Eat a diet that includes plenty of vegetables, fruits, low-fat dairy products, and lean protein.  Do not eat a lot of foods that are high in solid fats, added sugars, or sodium.   Maintain a healthy weight Body mass index (BMI) is used to identify weight problems. It estimates body fat based on height and weight. Your health care provider can help determine your BMI and help you achieve or maintain a healthy weight. Get regular exercise Get regular exercise. This is one of the most important things you can do for your health. Most adults should:  Exercise for at least 150 minutes each week. The exercise should increase your heart rate and make you sweat (moderate-intensity exercise).  Do strengthening exercises at least twice a week. This is in addition to the moderate-intensity exercise.  Spend less time sitting. Even light physical activity can be beneficial. Watch cholesterol and blood lipids Have your blood tested for lipids and cholesterol at 36 years of age, then have this test every 5 years. Have your cholesterol levels checked more often if:  Your lipid or cholesterol levels are high.  You are older than 36 years of age.  You are at high risk for heart disease. What should I know about cancer screening? Depending on your health history and family history, you may need to have cancer screening at various ages.  This may include screening for:  Breast cancer.  Cervical cancer.  Colorectal cancer.  Skin cancer.  Lung cancer. What should I know about heart disease, diabetes, and high blood pressure? Blood pressure and heart disease  High blood pressure causes heart disease and increases the risk of stroke. This is more likely to develop in people who have high blood pressure readings, are of African descent, or are overweight.  Have your blood pressure checked: ? Every 3-5 years if you are 73-32 years of age. ? Every year if you are 57 years old or older. Diabetes Have regular diabetes screenings. This checks your fasting blood sugar level. Have the screening done:  Once every three years after age 76 if you are at a normal weight and have a low risk for diabetes.  More often and at a younger age if you are overweight or have a high risk for diabetes. What should I know about preventing infection? Hepatitis B If you have a higher risk for hepatitis B, you should be screened for this virus. Talk with your health care provider to find out if you are at risk for hepatitis B infection. Hepatitis C Testing is recommended for:  Everyone born from [birth year] through 1966.  Anyone with known risk factors for hepatitis C. Sexually transmitted infections (STIs)  Get screened for STIs, including gonorrhea and chlamydia, if: ? You are sexually active and are younger than 36 years of age. ? You are older than 36 years of age and your  health care provider tells you that you are at risk for this type of infection. ? Your sexual activity has changed since you were last screened, and you are at increased risk for chlamydia or gonorrhea. Ask your health care provider if you are at risk.  Ask your health care provider about whether you are at high risk for HIV. Your health care provider may recommend a prescription medicine to help prevent HIV infection. If you choose to take medicine to prevent HIV, you  should first get tested for HIV. You should then be tested every 3 months for as long as you are taking the medicine. Pregnancy  If you are about to stop having your period (premenopausal) and you may become pregnant, seek counseling before you get pregnant.  Take 400 to 800 micrograms (mcg) of folic acid every day if you become pregnant.  Ask for birth control (contraception) if you want to prevent pregnancy. Osteoporosis and menopause Osteoporosis is a disease in which the bones lose minerals and strength with aging. This can result in bone fractures. If you are 13 years old or older, or if you are at risk for osteoporosis and fractures, ask your health care provider if you should:  Be screened for bone loss.  Take a calcium or vitamin D supplement to lower your risk of fractures.  Be given hormone replacement therapy (HRT) to treat symptoms of menopause. Follow these instructions at home: Lifestyle  Do not use any products that contain nicotine or tobacco, such as cigarettes, e-cigarettes, and chewing tobacco. If you need help quitting, ask your health care provider.  Do not use street drugs.  Do not share needles.  Ask your health care provider for help if you need support or information about quitting drugs. Alcohol use  Do not drink alcohol if: ? Your health care provider tells you not to drink. ? You are pregnant, may be pregnant, or are planning to become pregnant.  If you drink alcohol: ? Limit how much you use to 0-1 drink a day. ? Limit intake if you are breastfeeding.  Be aware of how much alcohol is in your drink. In the [place] one drink equals one 12 oz bottle of beer (355 mL), one 5 oz glass of wine (148 mL), or one 1 oz glass of hard liquor (44 mL). General instructions  Schedule regular health, dental, and eye exams.  Stay current with your vaccines.  Tell your health care provider if: ? You often feel depressed. ? You have ever been abused or do not feel  safe at home. Summary  Adopting a healthy lifestyle and getting preventive care are important in promoting health and wellness.  Follow your health care provider's instructions about healthy diet, exercising, and getting tested or screened for diseases.  Follow your health care provider's instructions on monitoring your cholesterol and blood pressure. This information is not intended to replace advice given to you by your health care provider. Make sure you discuss any questions you have with your health care provider. Document Revised: 10/16/2018 Document Reviewed: 10/16/2018 Elsevier Patient Education  2021 [employer].

## 2021-03-07 NOTE — Progress Notes (Signed)
   Subjective:   Patient ID: Allison Hicks, female    DOB: 11-Dec-1985, 35 y.o.   MRN: [id number]  HPI The patient is a 35 YO female coming in for physical. Also with several concerns:  HPI #2: Here for concerns about weight (having a hard time losing weight, struggling with appetite and managing foods with children, has had several friends to do phentermine short term to help motivate themselves to start and would like to try this, understands this is not a long term medication), and thyroid (would like to move care of this to [place] from endo, she has had stable dosing for some time, hashimoto thyroiditis diagnosed several years ago, denies current heat or cold intolerance, denies diarrhea or constipation), and situational anxiety (uses rare alprazolam and needs refill of this, denies using often, last refilled in 2020, refills have expired, denies wanting long term medication, is trying to cope with self care, not doing well lately, also not exercising much lately).   PMH, [facility], social history reviewed and updated  Review of Systems  Constitutional: Negative.   HENT: Negative.   Eyes: Negative.   Respiratory: Negative for cough, chest tightness and shortness of breath.   Cardiovascular: Negative for chest pain, palpitations and leg swelling.  Gastrointestinal: Negative for abdominal distention, abdominal pain, constipation, diarrhea, nausea and vomiting.  Musculoskeletal: Negative.   Skin: Negative.   Neurological: Negative.   Psychiatric/Behavioral: Negative.     Objective:  Physical Exam Constitutional:      Appearance: She is well-developed.  HENT:     Head: Normocephalic and atraumatic.  Cardiovascular:     Rate and Rhythm: Normal rate and regular rhythm.  Pulmonary:     Effort: Pulmonary effort is normal. No respiratory distress.     Breath sounds: Normal breath sounds. No wheezing or rales.  Abdominal:     General: Bowel sounds are normal. There is no distension.      Palpations: Abdomen is soft.     Tenderness: There is no abdominal tenderness. There is no rebound.  Musculoskeletal:     Cervical back: Normal range of motion.  Skin:    General: Skin is warm and dry.  Neurological:     Mental Status: She is alert and oriented to person, place, and time.     Coordination: Coordination normal.     Vitals:   03/07/21 1443  BP: 112/82  Pulse: 94  Resp: 18  Temp: 98.7 F (37.1 C)  TempSrc: Oral  SpO2: 98%  Weight: 156 lb (70.8 kg)  Height: 5\' 6"  (1.676 m)    This visit occurred during the SARS-CoV-2 public health emergency.  Safety protocols were in place, including screening questions prior to the visit, additional usage of staff PPE, and extensive cleaning of exam room while observing appropriate contact time as indicated for disinfecting solutions.   Assessment & Plan:

## 2021-03-08 DIAGNOSIS — E663 Overweight: Secondary | ICD-10-CM | POA: Insufficient documentation

## 2021-03-08 DIAGNOSIS — F418 Other specified anxiety disorders: Secondary | ICD-10-CM | POA: Insufficient documentation

## 2021-03-08 NOTE — Assessment & Plan Note (Signed)
Rx phentermine 1 month and she understands that there is no evidence for longer treatment with this. It is meant to help motivate change by decreasing appetite temporarily to help her establish different eating habits.

## 2021-03-08 NOTE — Assessment & Plan Note (Signed)
Flu shot yearly. Covid-19 2 shots encouraged booster. Tetanus up to date. Pap smear up to date with gyn. Counseled about sun safety and mole surveillance. Counseled about the dangers of distracted driving. Given 10 year screening recommendations.

## 2021-03-08 NOTE — Assessment & Plan Note (Signed)
Checking TSH and free T4 and adjust synthroid 75 mcg daily as needed. We will start managing this condition as per patient request.

## 2021-03-08 NOTE — Assessment & Plan Note (Signed)
Refill xanax which she uses rarely. She does not qualify for needing chronic treatment for now. Advised on risk of side effects with xanax long term and she agrees to use rarely.

## 2021-03-16 ENCOUNTER — Other Ambulatory Visit (HOSPITAL_COMMUNITY): Payer: Self-pay

## 2021-03-16 ENCOUNTER — Telehealth: Payer: 59 | Admitting: Physician Assistant

## 2021-03-16 DIAGNOSIS — R6889 Other general symptoms and signs: Secondary | ICD-10-CM

## 2021-03-16 MED ORDER — OSELTAMIVIR PHOSPHATE 75 MG PO CAPS
75.0000 mg | ORAL_CAPSULE | Freq: Two times a day (BID) | ORAL | 0 refills | Status: DC
  Filled 2021-03-16: qty 10, 5d supply, fill #0

## 2021-03-16 NOTE — Progress Notes (Signed)
I have spent 5 minutes in review of e-visit questionnaire, review and updating patient chart, medical decision making and response to patient.   Myracle Febres Cody Rosiland Sen, PA-C    

## 2021-03-16 NOTE — Progress Notes (Signed)

## 2021-03-18 NOTE — Addendum Note (Signed)
Addended by: Pricilla Holm A on: 03/18/2021 09:41 AM   Modules accepted: Orders

## 2021-03-24 ENCOUNTER — Other Ambulatory Visit (INDEPENDENT_AMBULATORY_CARE_PROVIDER_SITE_OTHER): Payer: 59

## 2021-03-24 DIAGNOSIS — E063 Autoimmune thyroiditis: Secondary | ICD-10-CM | POA: Diagnosis not present

## 2021-03-24 DIAGNOSIS — Z0000 Encounter for general adult medical examination without abnormal findings: Secondary | ICD-10-CM | POA: Diagnosis not present

## 2021-03-24 LAB — TSH: TSH: 0.95 u[IU]/mL (ref 0.35–4.50)

## 2021-03-24 LAB — COMPREHENSIVE METABOLIC PANEL
ALT: 13 U/L (ref 0–35)
AST: 15 U/L (ref 0–37)
Albumin: 4.3 g/dL (ref 3.5–5.2)
Alkaline Phosphatase: 62 U/L (ref 39–117)
BUN: 8 mg/dL (ref 6–23)
CO2: 26 mEq/L (ref 19–32)
Calcium: 9.3 mg/dL (ref 8.4–10.5)
Chloride: 105 mEq/L (ref 96–112)
Creatinine, Ser: 0.74 mg/dL (ref 0.40–1.20)
GFR: 104.18 mL/min (ref >60.00)
Glucose, Bld: 114 mg/dL — ABNORMAL HIGH (ref 70–99)
Potassium: 4.2 mEq/L (ref 3.5–5.1)
Sodium: 138 mEq/L (ref 135–145)
Total Bilirubin: 0.8 mg/dL (ref 0.2–1.2)
Total Protein: 6.7 g/dL (ref 6.0–8.3)

## 2021-03-24 LAB — CBC
HCT: 40.1 % (ref 36.0–46.0)
Hemoglobin: 14.2 g/dL (ref 12.0–15.0)
MCHC: 35.4 g/dL (ref 30.0–36.0)
MCV: 85.2 fl (ref 78.0–100.0)
Platelets: 206 10*3/uL (ref 150.0–400.0)
RBC: 4.71 Mil/uL (ref 3.87–5.11)
RDW: 12.7 % (ref 11.5–15.5)
WBC: 4.5 10*3/uL (ref 4.0–10.5)

## 2021-03-24 LAB — LIPID PANEL
Cholesterol: 159 mg/dL (ref 0–200)
HDL: 40.3 mg/dL (ref >39.00)
LDL Cholesterol: 94 mg/dL (ref 0–99)
NonHDL: 118.8
Total CHOL/HDL Ratio: 4
Triglycerides: 124 mg/dL (ref 0.0–149.0)
VLDL: 24.8 mg/dL (ref 0.0–40.0)

## 2021-03-24 LAB — T4, FREE: Free T4: 1.01 ng/dL (ref 0.60–1.60)

## 2021-03-25 ENCOUNTER — Other Ambulatory Visit: Payer: Self-pay | Admitting: Internal Medicine

## 2021-03-25 ENCOUNTER — Other Ambulatory Visit (INDEPENDENT_AMBULATORY_CARE_PROVIDER_SITE_OTHER): Payer: 59

## 2021-03-25 DIAGNOSIS — R739 Hyperglycemia, unspecified: Secondary | ICD-10-CM

## 2021-03-25 LAB — HEMOGLOBIN A1C: Hgb A1c MFr Bld: 5.4 % (ref 4.6–6.5)

## 2021-03-28 ENCOUNTER — Encounter: Payer: Self-pay | Admitting: Internal Medicine

## 2021-03-29 ENCOUNTER — Other Ambulatory Visit (HOSPITAL_COMMUNITY): Payer: Self-pay

## 2021-03-29 MED ORDER — LEVOTHYROXINE SODIUM 75 MCG PO TABS
75.0000 ug | ORAL_TABLET | Freq: Every day | ORAL | 3 refills | Status: DC
  Filled 2021-03-29: qty 90, 90d supply, fill #0
  Filled 2021-06-30: qty 90, 90d supply, fill #1
  Filled 2021-09-22: qty 90, 90d supply, fill #2
  Filled 2022-01-06: qty 90, 90d supply, fill #3

## 2021-04-02 ENCOUNTER — Other Ambulatory Visit (HOSPITAL_COMMUNITY): Payer: Self-pay

## 2021-04-02 MED ORDER — CARESTART COVID-19 HOME TEST VI KIT
PACK | 0 refills | Status: DC
  Filled 2021-04-02: qty 4, 4d supply, fill #0

## 2021-04-25 DIAGNOSIS — H5213 Myopia, bilateral: Secondary | ICD-10-CM | POA: Diagnosis not present

## 2021-05-05 DIAGNOSIS — Z6826 Body mass index (BMI) 26.0-26.9, adult: Secondary | ICD-10-CM | POA: Diagnosis not present

## 2021-05-05 DIAGNOSIS — Z01419 Encounter for gynecological examination (general) (routine) without abnormal findings: Secondary | ICD-10-CM | POA: Diagnosis not present

## 2021-05-05 DIAGNOSIS — E039 Hypothyroidism, unspecified: Secondary | ICD-10-CM | POA: Insufficient documentation

## 2021-05-18 ENCOUNTER — Other Ambulatory Visit: Payer: Self-pay

## 2021-05-18 LAB — HM PAP SMEAR

## 2021-05-18 MED ORDER — CARESTART COVID-19 HOME TEST VI KIT
PACK | 0 refills | Status: DC
  Filled 2021-05-18: qty 2, 4d supply, fill #0

## 2021-05-19 ENCOUNTER — Ambulatory Visit: Payer: Self-pay | Admitting: Surgery

## 2021-05-19 DIAGNOSIS — D171 Benign lipomatous neoplasm of skin and subcutaneous tissue of trunk: Secondary | ICD-10-CM | POA: Diagnosis not present

## 2021-06-13 ENCOUNTER — Other Ambulatory Visit (HOSPITAL_COMMUNITY): Payer: Self-pay

## 2021-06-13 MED ORDER — CARESTART COVID-19 HOME TEST VI KIT
PACK | 0 refills | Status: DC
  Filled 2021-06-13: qty 4, 4d supply, fill #0

## 2021-06-30 ENCOUNTER — Other Ambulatory Visit (HOSPITAL_COMMUNITY): Payer: Self-pay

## 2021-07-07 ENCOUNTER — Encounter (HOSPITAL_COMMUNITY): Payer: Self-pay

## 2021-07-07 ENCOUNTER — Encounter (HOSPITAL_COMMUNITY): Admission: RE | Admit: 2021-07-07 | Payer: 59 | Source: Ambulatory Visit

## 2021-07-18 ENCOUNTER — Encounter: Admission: RE | Payer: Self-pay | Source: Home / Self Care

## 2021-07-18 ENCOUNTER — Ambulatory Visit: Admission: RE | Admit: 2021-07-18 | Payer: 59 | Source: Home / Self Care | Admitting: Surgery

## 2021-07-18 SURGERY — EXCISION, MASS, UPPER EXTREMITY
Anesthesia: Monitor Anesthesia Care | Laterality: Right

## 2021-07-29 ENCOUNTER — Other Ambulatory Visit (HOSPITAL_COMMUNITY): Payer: Self-pay

## 2021-09-22 ENCOUNTER — Other Ambulatory Visit (HOSPITAL_COMMUNITY): Payer: Self-pay

## 2021-11-29 IMAGING — US US THYROID
1 series · 13 of 25 positions shown · non-contrast
Comparison: 12/23/2018

CLINICAL DATA: Thyroid nodule follow-up

EXAM:
THYROID ULTRASOUND
TECHNIQUE: Ultrasound examination of the thyroid gland and adjacent soft
tissues was performed.

[Series 1: us thyroid · 0.07mm/px · 13 of 40 slices shown]
[im 1/40]
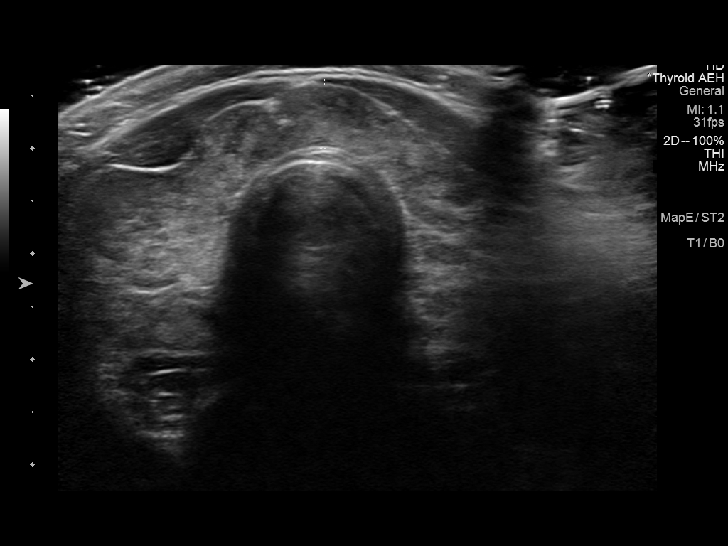
[im 4/40]
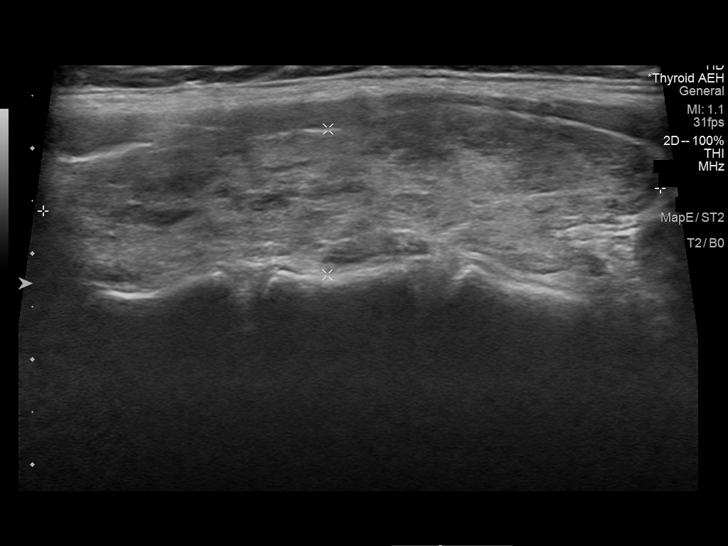
[im 7/40]
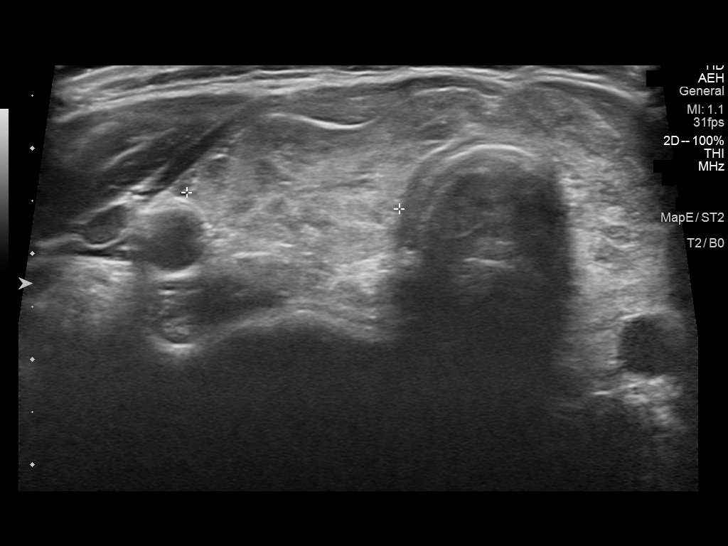
[im 10/40]
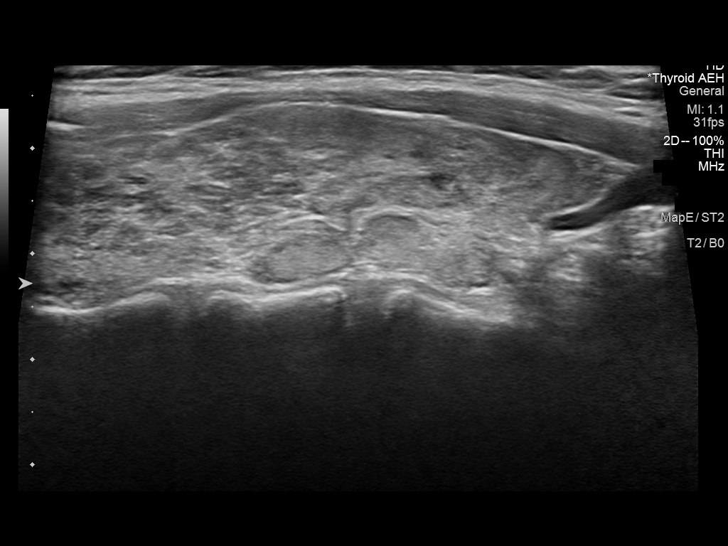
[im 14/40]
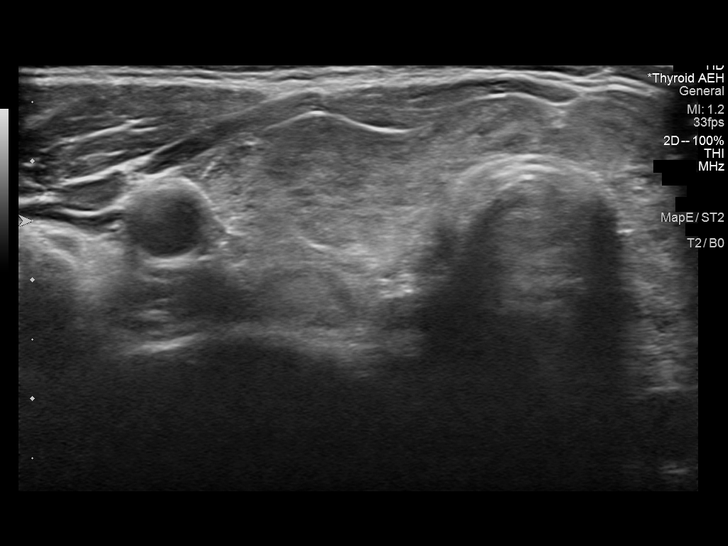
[im 17/40]
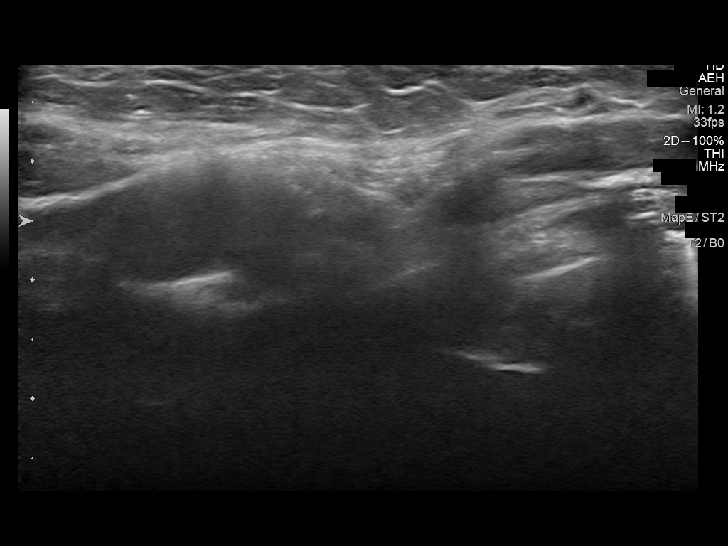
[im 20/40]
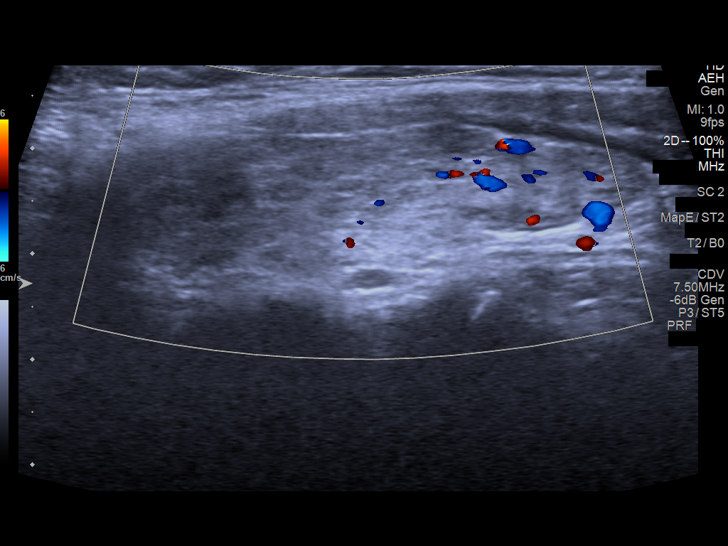
[im 23/40]
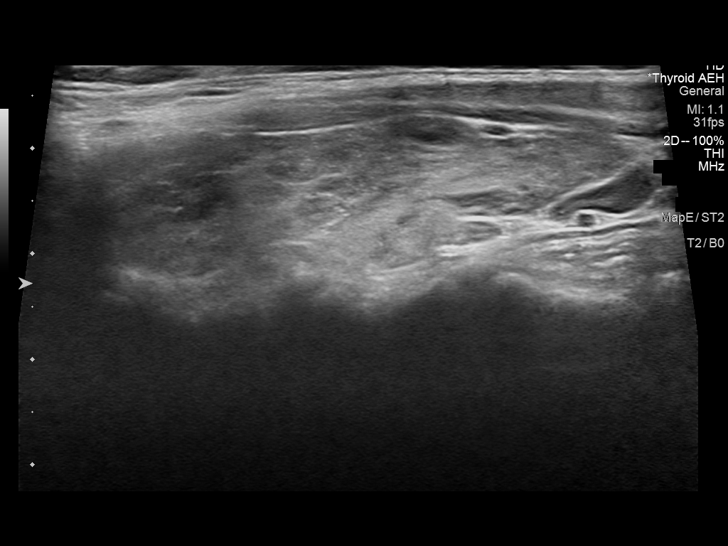
[im 27/40]
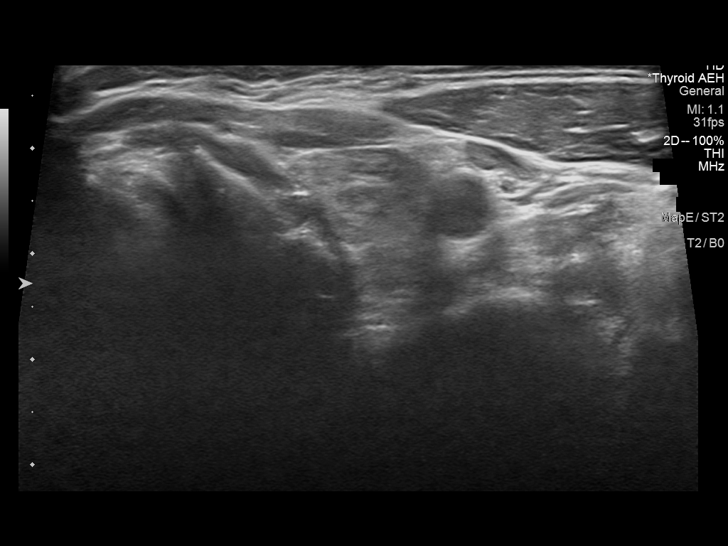
[im 30/40]
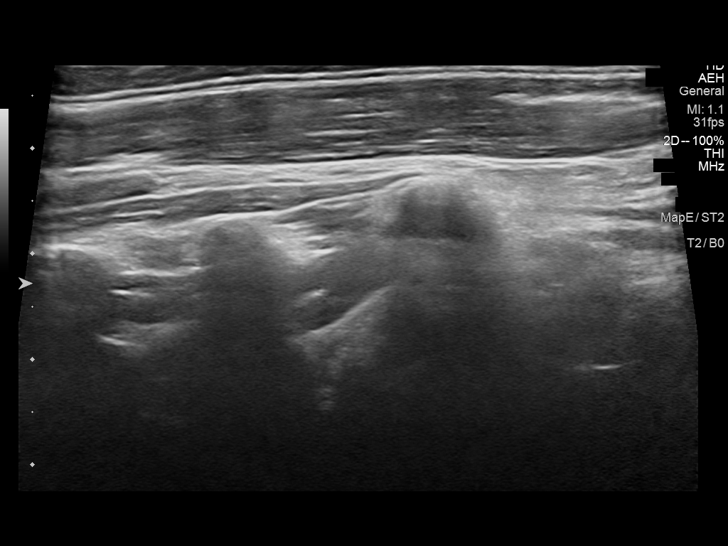
[im 33/40]
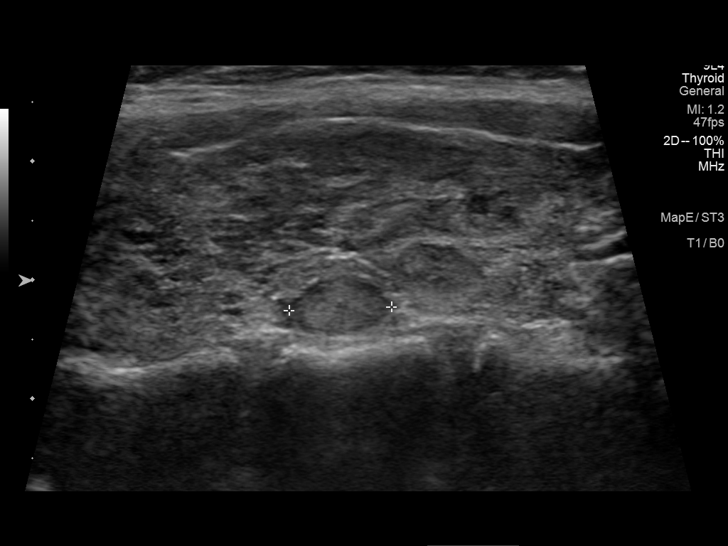
[im 36/40]
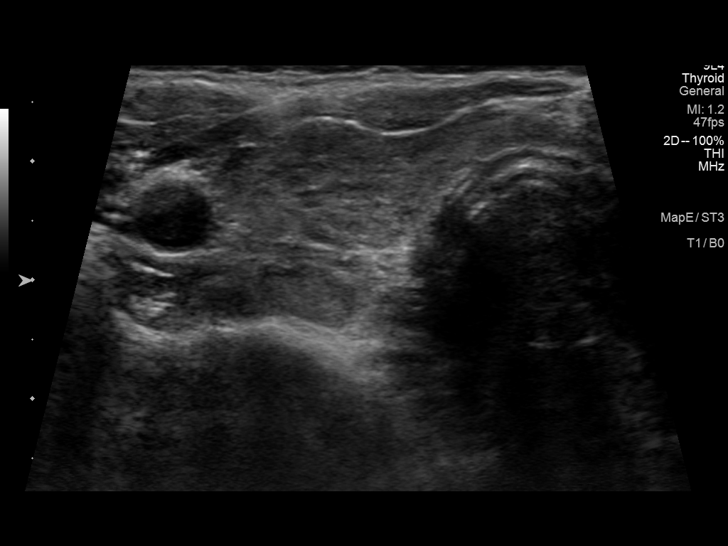
[im 40/40]
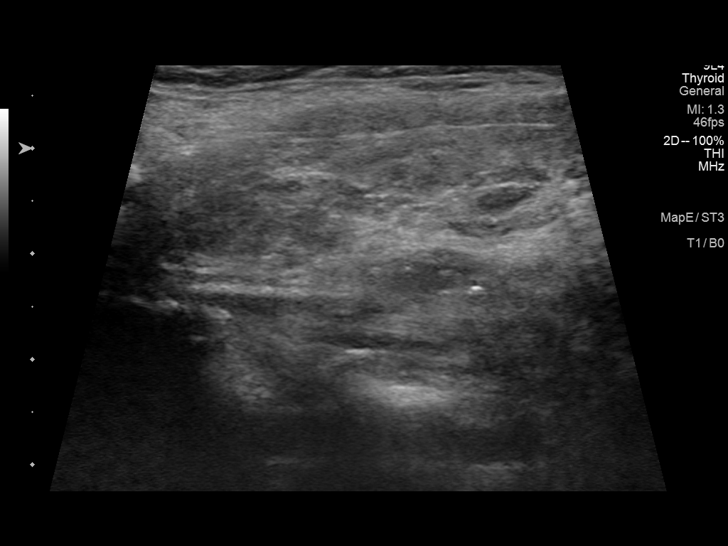

[13 of 25 positions shown; findings below may reference images not displayed]

FINDINGS: Parenchymal Echotexture: Markedly heterogenous

Isthmus: 0.6 cm

Right lobe: 5.9 x 1.4 x 2 cm

Left lobe: 4.9 x 1.5 x 1.6 cm

_________________________________________________________

Estimated total number of nodules >/= 1 cm: 0

Number of spongiform nodules >/=  2 cm not described below (TR1): 0

Number of mixed cystic and solid nodules >/= 1.5 cm not described
below (TR2): 0

_________________________________________________________

Nodule # 1:

Prior biopsy: No

Location: Right; Mid

Maximum size: 0.9 cm; Other 2 dimensions: 0.8 x 0.5 cm, previously,
1 x 0.9 x 0.5 cm

Composition: solid/almost completely solid (2)

Echogenicity: hypoechoic (2)

Shape: not taller-than-wide (0)

Margins: smooth (0)

Echogenic foci: none (0)

ACR TI-RADS total points: 4.

ACR TI-RADS risk category:  TR4 (4-6 points).

Significant change in size (>/= 20% in two dimensions and minimal
increase of 2 mm): No

Change in features: No

Change in ACR TI-RADS risk category: No

ACR TI-RADS recommendations:

Given size (<0.9 cm) and appearance, this nodule does NOT meet
TI-RADS criteria for biopsy or dedicated follow-up.

_________________________________________________________

There are no concerning cervical lymph nodes identified on today's
study.
IMPRESSION: 1. Markedly heterogeneous enlarged thyroid gland as detailed above.
2. Slight interval decrease in size of the dominant thyroid nodule
in the right mid thyroid gland. This thyroid nodule no longer meets
criteria for follow-up or FNA.

The above is in keeping with the ACR TI-RADS recommendations - [HOSPITAL] 2052;[DATE].

## 2022-01-06 ENCOUNTER — Other Ambulatory Visit (HOSPITAL_COMMUNITY): Payer: Self-pay

## 2022-02-13 ENCOUNTER — Telehealth: Payer: No Typology Code available for payment source | Admitting: Physician Assistant

## 2022-02-13 ENCOUNTER — Other Ambulatory Visit (HOSPITAL_COMMUNITY): Payer: Self-pay

## 2022-02-13 DIAGNOSIS — J020 Streptococcal pharyngitis: Secondary | ICD-10-CM

## 2022-02-13 MED ORDER — AZITHROMYCIN 250 MG PO TABS
ORAL_TABLET | ORAL | 0 refills | Status: AC
  Filled 2022-02-13: qty 6, 5d supply, fill #0

## 2022-02-13 NOTE — Progress Notes (Signed)
E-Visit for Sore Throat - Strep Symptoms  We are sorry that you are not feeling well.  Here is how we plan to help!  Based on what you have shared with me it is likely that you have strep pharyngitis.  Strep pharyngitis is inflammation and infection in the back of the throat.  This is an infection cause by bacteria and is treated with antibiotics.  I have prescribed Azithromycin 250 mg two tablets today and then one daily for 4 additional days. For throat pain, we recommend over the counter oral pain relief medications such as acetaminophen or aspirin, or anti-inflammatory medications such as ibuprofen or naproxen sodium. Topical treatments such as oral throat lozenges or sprays may be used as needed. Strep infections are not as easily transmitted as other respiratory infections, however we still recommend that you avoid close contact with loved ones, especially the very young and elderly.  Remember to wash your hands thoroughly throughout the day as this is the number one way to prevent the spread of infection and wipe down door knobs and counters with disinfectant.   Home Care: Only take medications as instructed by your medical team. Complete the entire course of an antibiotic. Do not take these medications with alcohol. A steam or ultrasonic humidifier can help congestion.  You can place a towel over your head and breathe in the steam from hot water coming from a faucet. Avoid close contacts especially the very young and the elderly. Cover your mouth when you cough or sneeze. Always remember to wash your hands.  Get Help Right Away If: You develop worsening fever or sinus pain. You develop a severe head ache or visual changes. Your symptoms persist after you have completed your treatment plan.  Make sure you Understand these instructions. Will watch your condition. Will get help right away if you are not doing well or get worse.   Thank you for choosing an e-visit.  Your e-visit  answers were reviewed by a board certified advanced clinical practitioner to complete your personal care plan. Depending upon the condition, your plan could have included both over the counter or prescription medications.  Please review your pharmacy choice. Make sure the pharmacy is open so you can pick up prescription now. If there is a problem, you may contact your provider through MyChart messaging and have the prescription routed to another pharmacy.  Your safety is important to us. If you have drug allergies check your prescription carefully.   For the next 24 hours you can use MyChart to ask questions about today's visit, request a non-urgent call back, or ask for a work or school excuse. You will get an email in the next two days asking about your experience. I hope that your e-visit has been valuable and will speed your recovery.  I provided 5 minutes of non face-to-face time during this encounter for chart review and documentation.   

## 2022-02-14 ENCOUNTER — Ambulatory Visit: Payer: 59 | Admitting: Family Medicine

## 2022-03-10 ENCOUNTER — Ambulatory Visit (INDEPENDENT_AMBULATORY_CARE_PROVIDER_SITE_OTHER): Payer: No Typology Code available for payment source | Admitting: Internal Medicine

## 2022-03-10 ENCOUNTER — Other Ambulatory Visit (HOSPITAL_COMMUNITY): Payer: Self-pay

## 2022-03-10 ENCOUNTER — Encounter: Payer: Self-pay | Admitting: Internal Medicine

## 2022-03-10 VITALS — BP 114/62 | HR 67 | Resp 18 | Ht 66.0 in | Wt 159.2 lb

## 2022-03-10 DIAGNOSIS — G43B0 Ophthalmoplegic migraine, not intractable: Secondary | ICD-10-CM

## 2022-03-10 DIAGNOSIS — Z0000 Encounter for general adult medical examination without abnormal findings: Secondary | ICD-10-CM | POA: Diagnosis not present

## 2022-03-10 DIAGNOSIS — E063 Autoimmune thyroiditis: Secondary | ICD-10-CM

## 2022-03-10 DIAGNOSIS — F418 Other specified anxiety disorders: Secondary | ICD-10-CM

## 2022-03-10 DIAGNOSIS — E663 Overweight: Secondary | ICD-10-CM | POA: Diagnosis not present

## 2022-03-10 MED ORDER — TRIAMCINOLONE ACETONIDE 0.1 % EX CREA
1.0000 "application " | TOPICAL_CREAM | Freq: Two times a day (BID) | CUTANEOUS | 0 refills | Status: DC
  Filled 2022-03-10: qty 80, 30d supply, fill #0

## 2022-03-10 MED ORDER — ALPRAZOLAM 0.25 MG PO TABS
0.2500 mg | ORAL_TABLET | Freq: Every day | ORAL | 2 refills | Status: DC | PRN
  Filled 2022-03-10: qty 30, 30d supply, fill #0
  Filled 2022-05-07: qty 30, 30d supply, fill #1
  Filled 2022-07-03: qty 30, 30d supply, fill #2

## 2022-03-10 MED ORDER — PHENTERMINE HCL 37.5 MG PO TABS
37.5000 mg | ORAL_TABLET | Freq: Every day | ORAL | 0 refills | Status: DC
  Filled 2022-03-10: qty 30, 30d supply, fill #0

## 2022-03-10 NOTE — Progress Notes (Signed)
? ?  Subjective:  ? ?Patient ID: Allison Hicks, female    DOB: 28-Dec-1985, 36 y.o.   MRN: [id number] ? ?HPI ?The patient is here for physical. ? ?PMH, [facility], social history reviewed and updated ? ?Review of Systems  ?Constitutional: Negative.   ?HENT: Negative.    ?Eyes: Negative.   ?Respiratory:  Negative for cough, chest tightness and shortness of breath.   ?Cardiovascular:  Negative for chest pain, palpitations and leg swelling.  ?Gastrointestinal:  Negative for abdominal distention, abdominal pain, constipation, diarrhea, nausea and vomiting.  ?Musculoskeletal: Negative.   ?Skin:  Positive for rash.  ?Neurological: Negative.   ?Psychiatric/Behavioral: Negative.    ? ?Objective:  ?Physical Exam ?Constitutional:   ?   Appearance: She is well-developed.  ?HENT:  ?   Head: Normocephalic and atraumatic.  ?Cardiovascular:  ?   Rate and Rhythm: Normal rate and regular rhythm.  ?Pulmonary:  ?   Effort: Pulmonary effort is normal. No respiratory distress.  ?   Breath sounds: Normal breath sounds. No wheezing or rales.  ?Abdominal:  ?   General: Bowel sounds are normal. There is no distension.  ?   Palpations: Abdomen is soft.  ?   Tenderness: There is no abdominal tenderness. There is no rebound.  ?Musculoskeletal:  ?   Cervical back: Normal range of motion.  ?Skin: ?   General: Skin is warm and dry.  ?   Findings: Rash present.  ?Neurological:  ?   Mental Status: She is alert and oriented to person, place, and time.  ?   Coordination: Coordination normal.  ? ? ?Vitals:  ? 03/10/22 1443  ?BP: 114/62  ?Pulse: 67  ?Resp: 18  ?SpO2: 98%  ?Weight: 159 lb 3.2 oz (72.2 kg)  ?Height: '5\' 6"'$  (1.676 m)  ? ? ?This visit occurred during the SARS-CoV-2 public health emergency.  Safety protocols were in place, including screening questions prior to the visit, additional usage of staff PPE, and extensive cleaning of exam room while observing appropriate contact time as indicated for disinfecting solutions.  ? ?Assessment & Plan:  ? ?

## 2022-03-10 NOTE — Assessment & Plan Note (Signed)
Checking TSH and free T4 and adjust synthroid 75 mcg daily as needed.  

## 2022-03-10 NOTE — Assessment & Plan Note (Signed)
Doing well and rare alprazolam usage. Refilled #30 2 refills today. Last filled 2022. Counseled on coping skills as well. Not exercising.  ?

## 2022-03-10 NOTE — Assessment & Plan Note (Signed)
Rx phentermine to use rarely to help with weight. Weight is up additional 3 pounds since last year. Encouraged about diet and exercise. ?

## 2022-03-10 NOTE — Assessment & Plan Note (Signed)
Flu shot yearly. covid-19 counseled. Tetanus up to date. Pap smear up to date with gyn. Counseled about sun safety and mole surveillance. Counseled about the dangers of distracted driving. Given 10 year screening recommendations.   

## 2022-03-10 NOTE — Assessment & Plan Note (Signed)
Rare and checking CMP and CBC to ensure safety of ongoing treatment.  ?

## 2022-03-13 ENCOUNTER — Other Ambulatory Visit (HOSPITAL_COMMUNITY): Payer: Self-pay

## 2022-03-22 ENCOUNTER — Telehealth: Payer: Self-pay

## 2022-03-22 ENCOUNTER — Other Ambulatory Visit (INDEPENDENT_AMBULATORY_CARE_PROVIDER_SITE_OTHER): Payer: No Typology Code available for payment source

## 2022-03-22 DIAGNOSIS — E063 Autoimmune thyroiditis: Secondary | ICD-10-CM

## 2022-03-22 DIAGNOSIS — G43B0 Ophthalmoplegic migraine, not intractable: Secondary | ICD-10-CM | POA: Diagnosis not present

## 2022-03-22 DIAGNOSIS — Z0000 Encounter for general adult medical examination without abnormal findings: Secondary | ICD-10-CM | POA: Diagnosis not present

## 2022-03-22 LAB — COMPREHENSIVE METABOLIC PANEL
ALT: 12 U/L (ref 0–35)
AST: 17 U/L (ref 0–37)
Albumin: 4.6 g/dL (ref 3.5–5.2)
Alkaline Phosphatase: 51 U/L (ref 39–117)
BUN: 8 mg/dL (ref 6–23)
CO2: 26 mEq/L (ref 19–32)
Calcium: 10 mg/dL (ref 8.4–10.5)
Chloride: 103 mEq/L (ref 96–112)
Creatinine, Ser: 0.83 mg/dL (ref 0.40–1.20)
GFR: 90.15 mL/min (ref >60.00)
Glucose, Bld: 102 mg/dL — ABNORMAL HIGH (ref 70–99)
Potassium: 4.4 mEq/L (ref 3.5–5.1)
Sodium: 138 mEq/L (ref 135–145)
Total Bilirubin: 0.6 mg/dL (ref 0.2–1.2)
Total Protein: 7 g/dL (ref 6.0–8.3)

## 2022-03-22 LAB — HEMOGLOBIN A1C: Hgb A1c MFr Bld: 5 % (ref 4.6–6.5)

## 2022-03-22 LAB — CBC
HCT: 41.1 % (ref 36.0–46.0)
Hemoglobin: 14.1 g/dL (ref 12.0–15.0)
MCHC: 34.3 g/dL (ref 30.0–36.0)
MCV: 88.3 fl (ref 78.0–100.0)
Platelets: 246 10*3/uL (ref 150.0–400.0)
RBC: 4.65 Mil/uL (ref 3.87–5.11)
RDW: 12.4 % (ref 11.5–15.5)
WBC: 4.8 10*3/uL (ref 4.0–10.5)

## 2022-03-22 LAB — LIPID PANEL
Cholesterol: 160 mg/dL (ref 0–200)
HDL: 53.9 mg/dL (ref >39.00)
LDL Cholesterol: 95 mg/dL (ref 0–99)
NonHDL: 106.38
Total CHOL/HDL Ratio: 3
Triglycerides: 59 mg/dL (ref 0.0–149.0)
VLDL: 11.8 mg/dL (ref 0.0–40.0)

## 2022-03-22 LAB — VITAMIN D 25 HYDROXY (VIT D DEFICIENCY, FRACTURES): VITD: >120 ng/mL

## 2022-03-22 LAB — TSH: TSH: 2.43 u[IU]/mL (ref 0.35–5.50)

## 2022-03-22 LAB — T4, FREE: Free T4: 1.04 ng/dL (ref 0.60–1.60)

## 2022-03-22 NOTE — Telephone Encounter (Signed)
Please call patient.  Let her know her vitamin D level is very high-more than 120.  This potentially can cause side effects.  I would recommend holding vitamin D supplementation for 1 month and then decreasing vitamin D intake by 50%.  Should have her vitamin D rechecked 3-4 months. ? ?Dr. Sharlet Salina will inform her of her other blood work results ?

## 2022-03-22 NOTE — Telephone Encounter (Signed)
CRITICAL VALUE STICKER ? ?CRITICAL VALUE: Vitamin D greater than 120 ? ?RECEIVER (on-site recipient of call): [place], [place] ? ?DATE & TIME NOTIFIED:  ?03/22/2022 @ 10:42 am  ?MESSENGER (representative from lab): Roger Kill ? ?MD NOTIFIED: Quay Burow.  ? ?TIME OF NOTIFICATION: 10:44 am  ? ?RESPONSE:  ? ?

## 2022-03-23 NOTE — Telephone Encounter (Signed)
See my chart message

## 2022-04-07 ENCOUNTER — Other Ambulatory Visit: Payer: Self-pay | Admitting: Internal Medicine

## 2022-04-07 ENCOUNTER — Other Ambulatory Visit (HOSPITAL_COMMUNITY): Payer: Self-pay

## 2022-04-07 MED ORDER — LEVOTHYROXINE SODIUM 75 MCG PO TABS
75.0000 ug | ORAL_TABLET | Freq: Every day | ORAL | 3 refills | Status: DC
  Filled 2022-04-07: qty 90, 90d supply, fill #0
  Filled 2022-07-03: qty 90, 90d supply, fill #1
  Filled 2022-10-02: qty 90, 90d supply, fill #2
  Filled 2022-12-26: qty 30, 30d supply, fill #3
  Filled 2023-01-30: qty 30, 30d supply, fill #4
  Filled 2023-02-06: qty 60, 60d supply, fill #4

## 2022-05-08 ENCOUNTER — Other Ambulatory Visit (HOSPITAL_COMMUNITY): Payer: Self-pay

## 2022-06-13 ENCOUNTER — Other Ambulatory Visit: Payer: Self-pay | Admitting: Obstetrics and Gynecology

## 2022-06-13 DIAGNOSIS — R928 Other abnormal and inconclusive findings on diagnostic imaging of breast: Secondary | ICD-10-CM

## 2022-06-20 ENCOUNTER — Other Ambulatory Visit: Payer: Self-pay | Admitting: Obstetrics and Gynecology

## 2022-06-20 ENCOUNTER — Ambulatory Visit
Admission: RE | Admit: 2022-06-20 | Discharge: 2022-06-20 | Disposition: A | Discharge status: Home / Self Care | Payer: No Typology Code available for payment source | Source: Ambulatory Visit | Attending: Obstetrics and Gynecology | Admitting: Obstetrics and Gynecology

## 2022-06-20 DIAGNOSIS — R921 Mammographic calcification found on diagnostic imaging of breast: Secondary | ICD-10-CM

## 2022-06-20 DIAGNOSIS — R928 Other abnormal and inconclusive findings on diagnostic imaging of breast: Secondary | ICD-10-CM

## 2022-06-25 ENCOUNTER — Telehealth: Payer: No Typology Code available for payment source | Admitting: Family

## 2022-06-25 DIAGNOSIS — H109 Unspecified conjunctivitis: Secondary | ICD-10-CM

## 2022-06-25 MED ORDER — POLYMYXIN B-TRIMETHOPRIM 10000-0.1 UNIT/ML-% OP SOLN: 1.0000 [drp] | Freq: Four times a day (QID) | OPHTHALMIC | 0 refills | Status: DC

## 2022-06-25 NOTE — Progress Notes (Signed)

## 2022-06-30 ENCOUNTER — Ambulatory Visit
Admission: RE | Admit: 2022-06-30 | Discharge: 2022-06-30 | Disposition: A | Discharge status: Home / Self Care | Payer: No Typology Code available for payment source | Source: Ambulatory Visit | Attending: Obstetrics and Gynecology | Admitting: Obstetrics and Gynecology

## 2022-06-30 DIAGNOSIS — R921 Mammographic calcification found on diagnostic imaging of breast: Secondary | ICD-10-CM

## 2022-07-03 ENCOUNTER — Other Ambulatory Visit (HOSPITAL_COMMUNITY): Payer: Self-pay

## 2022-10-02 ENCOUNTER — Other Ambulatory Visit (HOSPITAL_COMMUNITY): Payer: Self-pay

## 2022-12-11 ENCOUNTER — Telehealth: Payer: 59 | Admitting: Family Medicine

## 2022-12-11 ENCOUNTER — Other Ambulatory Visit (HOSPITAL_COMMUNITY): Payer: Self-pay

## 2022-12-11 DIAGNOSIS — R6889 Other general symptoms and signs: Secondary | ICD-10-CM

## 2022-12-11 DIAGNOSIS — Z20828 Contact with and (suspected) exposure to other viral communicable diseases: Secondary | ICD-10-CM

## 2022-12-11 MED ORDER — PROMETHAZINE-DM 6.25-15 MG/5ML PO SYRP
5.0000 mL | ORAL_SOLUTION | Freq: Four times a day (QID) | ORAL | 0 refills | Status: DC | PRN
  Filled 2022-12-11: qty 118, 6d supply, fill #0

## 2022-12-11 MED ORDER — OSELTAMIVIR PHOSPHATE 75 MG PO CAPS
75.0000 mg | ORAL_CAPSULE | Freq: Two times a day (BID) | ORAL | 0 refills | Status: AC
  Filled 2022-12-11: qty 10, 5d supply, fill #0

## 2022-12-11 MED ORDER — BENZONATATE 100 MG PO CAPS
100.0000 mg | ORAL_CAPSULE | Freq: Three times a day (TID) | ORAL | 0 refills | Status: DC | PRN
  Filled 2022-12-11: qty 20, 7d supply, fill #0

## 2022-12-11 NOTE — Progress Notes (Signed)
E visit for Flu like symptoms   We are sorry that you are not feeling well.  Here is how we plan to help! Based on what you have shared with me it looks like you may have a respiratory virus that may be influenza.  Influenza or "the flu" is   an infection caused by a respiratory virus. The flu virus is highly contagious and persons who did not receive their yearly flu vaccination may "catch" the flu from close contact.  We have anti-viral medications to treat the viruses that cause this infection. They are not a "cure" and only shorten the course of the infection. These prescriptions are most effective when they are given within the first 2 days of "flu" symptoms. Antiviral medication are indicated if you have a high risk of complications from the flu. You should  also consider an antiviral medication if you are in close contact with someone who is at risk. These medications can help patients avoid complications from the flu  but have side effects that you should know. Possible side effects from Tamiflu or oseltamivir include nausea, vomiting, diarrhea, dizziness, headaches, eye redness, sleep problems or other respiratory symptoms. You should not take Tamiflu if you have an allergy to oseltamivir or any to the ingredients in Tamiflu.  Based upon your symptoms and potential risk factors I have prescribed Oseltamivir (Tamiflu).  It has been sent to your designated pharmacy.  You will take one 75 mg capsule orally twice a day for the next 5 days. and I recommend that you follow the flu symptoms recommendation that I have listed below.  ANYONE WHO HAS FLU SYMPTOMS SHOULD: Stay home. The flu is highly contagious and going out or to work exposes others! Be sure to drink plenty of fluids. Water is fine as well as fruit juices, sodas and electrolyte beverages. You may want to stay away from caffeine or alcohol. If you are nauseated, try taking small sips of liquids. How do you know if you are getting enough  fluid? Your urine should be a pale yellow or almost colorless. Get rest. Taking a steamy shower or using a humidifier may help nasal congestion and ease sore throat pain. Using a saline nasal spray works much the same way. Cough drops, hard candies and sore throat lozenges may ease your cough. Line up a caregiver. Have someone check on you regularly.   GET HELP RIGHT AWAY IF: You cannot keep down liquids or your medications. You become short of breath Your fell like you are going to pass out or loose consciousness. Your symptoms persist after you have completed your treatment plan MAKE SURE YOU  Understand these instructions. Will watch your condition. Will get help right away if you are not doing well or get worse.  Your e-visit answers were reviewed by a board certified advanced clinical practitioner to complete your personal care plan.  Depending on the condition, your plan could have included both over the counter or prescription medications.  If there is a problem please reply  once you have received a response from your provider.  Your safety is important to us.  If you have drug allergies check your prescription carefully.    You can use MyChart to ask questions about today's visit, request a non-urgent call back, or ask for a work or school excuse for 24 hours related to this e-Visit. If it has been greater than 24 hours you will need to follow up with your provider, or enter a   new e-Visit to address those concerns.  You will get an e-mail in the next two days asking about your experience.  I hope that your e-visit has been valuable and will speed your recovery. Thank you for using e-visits.  I provided 5 minutes of non face-to-face time during this encounter for chart review, medication and order placement, as well as and documentation.   

## 2022-12-11 NOTE — Addendum Note (Signed)
Addended by: Perlie Mayo on: 12/11/2022 08:04 AM   Modules accepted: Orders

## 2022-12-14 ENCOUNTER — Encounter: Payer: Self-pay | Admitting: Family Medicine

## 2022-12-14 ENCOUNTER — Ambulatory Visit (INDEPENDENT_AMBULATORY_CARE_PROVIDER_SITE_OTHER): Payer: 59 | Admitting: Family Medicine

## 2022-12-14 ENCOUNTER — Other Ambulatory Visit (HOSPITAL_COMMUNITY): Payer: Self-pay

## 2022-12-14 VITALS — BP 106/70 | HR 93 | Temp 97.6°F | Ht 66.0 in | Wt 167.0 lb

## 2022-12-14 DIAGNOSIS — J029 Acute pharyngitis, unspecified: Secondary | ICD-10-CM

## 2022-12-14 DIAGNOSIS — R051 Acute cough: Secondary | ICD-10-CM

## 2022-12-14 LAB — POCT RESPIRATORY SYNCYTIAL VIRUS: RSV Rapid Ag: NEGATIVE

## 2022-12-14 LAB — POCT RAPID STREP A (OFFICE): Rapid Strep A Screen: NEGATIVE

## 2022-12-14 LAB — POC COVID19 BINAXNOW: SARS Coronavirus 2 Ag: NEGATIVE

## 2022-12-14 MED ORDER — HYDROCOD POLI-CHLORPHE POLI ER 10-8 MG/5ML PO SUER
5.0000 mL | Freq: Every evening | ORAL | 0 refills | Status: DC | PRN
  Filled 2022-12-14: qty 115, 23d supply, fill #0

## 2022-12-14 NOTE — Progress Notes (Signed)
Subjective:  Allison Hicks is a 37 y.o. female who presents for a 5 day hx of low grade fever, ST, body aches, headache, and cough.  She had known exposure to influenza last week. She was seen via E-visit. Prescribed Tamiflu but did not take it since she was feeling better.   States Tussionex is the only cough medication helps her sleep when she is sick.  She was prescribed Promethazine DM and Tessalon Perles which are ineffective.   No other aggravating or relieving factors.  No other c/o.  ROS as in subjective.   Objective: Vitals:   12/14/22 1420  BP: 106/70  Pulse: 93  Temp: 97.6 F (36.4 C)  SpO2: 99%    General appearance: Alert, WD/WN, no distress                             Skin: warm, no rash                           Head: no sinus tenderness                            Eyes: conjunctiva normal, corneas clear, PERRLA                            Ears: pearly TMs, clear fluid behind right TM, external ear canals normal                          Nose: mask on              Mouth/throat: MMM, tongue normal, mild-mod pharyngeal erythema. No exudate                           Neck: supple, no adenopathy, no thyromegaly, nontender                          Heart: RRR                         Lungs: CTA bilaterally, no wheezes, rales, or rhonchi      Assessment: Acute pharyngitis, unspecified etiology - Plan: POCT rapid strep A, POC COVID-19, POCT respiratory syncytial virus  Acute cough - Plan: POCT rapid strep A, POC COVID-19, POCT respiratory syncytial virus, chlorpheniramine-HYDROcodone (TUSSIONEX) 10-8 MG/5ML   Plan: Negative for COVID, RSV and strep throat. Discussed diagnosis and treatment of URI.  Suggested symptomatic OTC remedies. Salt water gargles, throat lozenges and hydration.  Ibuprofen and Tylenol. Mucinex for cough and chest congestion. Tussionex prescribed per patient request.  Tessalon and Promethazine DM ineffective. Advised her to use caution due to the  sedating side effects. Follow-up if worsening

## 2022-12-14 NOTE — Patient Instructions (Signed)
You are negative for COVID, RSV and strep throat today.  You appear to have a viral illness.  I recommend continuing with symptomatic management.  I prescribed a sedating cough medication called Tussionex today.  Please do not take this and drive, operate machinery or drink alcohol.  Follow-up if you are getting worse or not back to baseline in approximately 1 week.

## 2022-12-27 ENCOUNTER — Other Ambulatory Visit: Payer: Self-pay

## 2023-01-25 ENCOUNTER — Encounter: Payer: Self-pay | Admitting: Family Medicine

## 2023-01-25 ENCOUNTER — Ambulatory Visit (INDEPENDENT_AMBULATORY_CARE_PROVIDER_SITE_OTHER): Payer: 59 | Admitting: Family Medicine

## 2023-01-25 VITALS — BP 108/62 | HR 82 | Temp 97.8°F | Ht 66.0 in | Wt 167.0 lb

## 2023-01-25 DIAGNOSIS — R1313 Dysphagia, pharyngeal phase: Secondary | ICD-10-CM

## 2023-01-25 DIAGNOSIS — R09A2 Foreign body sensation, throat: Secondary | ICD-10-CM | POA: Diagnosis not present

## 2023-01-25 DIAGNOSIS — E063 Autoimmune thyroiditis: Secondary | ICD-10-CM | POA: Diagnosis not present

## 2023-01-25 DIAGNOSIS — R590 Localized enlarged lymph nodes: Secondary | ICD-10-CM

## 2023-01-25 DIAGNOSIS — E041 Nontoxic single thyroid nodule: Secondary | ICD-10-CM

## 2023-01-25 DIAGNOSIS — E038 Other specified hypothyroidism: Secondary | ICD-10-CM

## 2023-01-25 LAB — CBC WITH DIFFERENTIAL/PLATELET
Basophils Absolute: 0 10*3/uL (ref 0.0–0.1)
Basophils Relative: 0.6 % (ref 0.0–3.0)
Eosinophils Absolute: 0.1 10*3/uL (ref 0.0–0.7)
Eosinophils Relative: 1.6 % (ref 0.0–5.0)
HCT: 44 % (ref 36.0–46.0)
Hemoglobin: 15.2 g/dL — ABNORMAL HIGH (ref 12.0–15.0)
Lymphocytes Relative: 30 % (ref 12.0–46.0)
Lymphs Abs: 1.6 10*3/uL (ref 0.7–4.0)
MCHC: 34.6 g/dL (ref 30.0–36.0)
MCV: 88.2 fl (ref 78.0–100.0)
Monocytes Absolute: 0.3 10*3/uL (ref 0.1–1.0)
Monocytes Relative: 5.5 % (ref 3.0–12.0)
Neutro Abs: 3.4 10*3/uL (ref 1.4–7.7)
Neutrophils Relative %: 62.3 % (ref 43.0–77.0)
Platelets: 287 10*3/uL (ref 150.0–400.0)
RBC: 4.99 Mil/uL (ref 3.87–5.11)
RDW: 12.2 % (ref 11.5–15.5)
WBC: 5.4 10*3/uL (ref 4.0–10.5)

## 2023-01-25 LAB — TSH: TSH: 1.43 u[IU]/mL (ref 0.35–5.50)

## 2023-01-25 LAB — BASIC METABOLIC PANEL
BUN: 7 mg/dL (ref 6–23)
CO2: 29 mEq/L (ref 19–32)
Calcium: 10.1 mg/dL (ref 8.4–10.5)
Chloride: 104 mEq/L (ref 96–112)
Creatinine, Ser: 0.76 mg/dL (ref 0.40–1.20)
GFR: 99.61 mL/min (ref >60.00)
Glucose, Bld: 91 mg/dL (ref 70–99)
Potassium: 4.4 mEq/L (ref 3.5–5.1)
Sodium: 139 mEq/L (ref 135–145)

## 2023-01-25 NOTE — Progress Notes (Signed)
Subjective:     Patient ID: Allison Hicks, female    DOB: 26-Jan-1986, 37 y.o.   MRN: [id number]  Chief Complaint  Patient presents with   Sore Throat    Throat pressure for several months, comes and goes but more recently feeling more pressure "like a rock" in her throat   Dysphagia    HPI Patient is in today for a several month hx of sensation of pressure and fullness in her neck along the thyroid but also beneath her chin. Difficulty swallowing. Felt a "rock" sensation at one point.   States 4-5 years ago she had a similar sensation and she was found to have an enlarged thyroid gland.   Denies fever, chills, dizziness, chest pain, palpitations, shortness of breath, abdominal pain, N/V/D.   Requests referral to [facility] endocrinologist for thyroid disorder.    Health Maintenance Due  Topic Date Due   Hepatitis C Screening  Never done   PAP SMEAR-Modifier  12/27/2019    Past Medical History:  Diagnosis Date   Allergy    Asthma    Headache    Migraines     Past Surgical History:  Procedure Laterality Date   WISDOM TOOTH EXTRACTION      Family History  Problem Relation Age of Onset   Breast cancer Mother    Thyroid disease Sister    Other Brother        brain tumor   Brain cancer Brother    Thyroid disease Maternal Grandmother    Diabetes Maternal Grandfather    Hyperlipidemia Paternal Grandfather    Parkinson's disease Paternal Grandfather    Diabetes Maternal Aunt    Breast cancer Maternal Aunt    Thyroid disease Maternal Aunt    Breast cancer Maternal Aunt     Social History   Socioeconomic History   Marital status: Married    Spouse name: Not on file   Number of children: 3   Years of education: Not on file   Highest education level: Not on file  Occupational History   Not on file  Tobacco Use   Smoking status: Never   Smokeless tobacco: Never  Vaping Use   Vaping Use: Never used  Substance and Sexual Activity   Alcohol use: No     Comment: Social   Drug use: No   Sexual activity: Yes    Birth control/protection: Pill  Other Topics Concern   Not on file  Social History Narrative   Not on file   Social Determinants of Health   Financial Resource Strain: Patient Declined (01/25/2023)   Overall Financial Resource Strain (CARDIA)    Difficulty of Paying Living Expenses: Patient declined  Food Insecurity: Patient Declined (01/25/2023)   Hunger Vital Sign    Worried About Running Out of Food in the Last Year: Patient declined    [place] in the Last Year: Patient declined  Transportation Needs: Patient Declined (01/25/2023)   [place] - Hydrologist (Medical): Patient declined    Lack of Transportation (Non-Medical): Patient declined  Physical Activity: Insufficiently Active (01/25/2023)   Exercise Vital Sign    Days of Exercise per Week: 1 day    Minutes of Exercise per Session: 10 min  Stress: Stress Concern Present (01/25/2023)   [employer] [place]    Feeling of Stress : To some extent  Social Connections: Unknown (01/25/2023)   Social Connection and Isolation Panel [NHANES]  Frequency of Communication with Friends and Family: Patient declined    Frequency of Social Gatherings with Friends and Family: Patient declined    Attends Religious Services: Patient declined    Marine scientist or Organizations: Yes    Attends Archivist Meetings: Patient declined    Marital Status: Married  Human resources officer Violence: Not on file    Outpatient Medications Prior to Visit  Medication Sig Dispense Refill   acetaminophen (TYLENOL) 500 MG tablet Take 500 mg by mouth daily as needed for headache.     ALPRAZolam (XANAX) 0.25 MG tablet Take 1 tablet (0.25 mg total) by mouth daily as needed for anxiety. 30 tablet 2   cetirizine (ZYRTEC) 10 MG tablet Take 10 mg by mouth at bedtime.     ibuprofen (ADVIL) 200 MG tablet  Take 400 mg by mouth daily as needed for headache.     levonorgestrel (MIRENA) 20 MCG/24HR IUD 1 each by Intrauterine route once.     levothyroxine (SYNTHROID) 75 MCG tablet Take 1 tablet (75 mcg total) by mouth daily before breakfast. 90 tablet 3   benzonatate (TESSALON) 100 MG capsule Take 1 capsule (100 mg total) by mouth 3 (three) times daily as needed for cough. (Patient not taking: Reported on 01/25/2023) 20 capsule 0   Biotin 10000 MCG TABS Take 10,000 mcg by mouth at bedtime. (Patient not taking: Reported on 01/25/2023)     chlorpheniramine-HYDROcodone (TUSSIONEX) 10-8 MG/5ML Take 5 mLs by mouth at bedtime as needed for cough. 115 mL 0   Cholecalciferol (DIALYVITE VITAMIN D 5000) 125 MCG (5000 UT) capsule Take 5,000-10,000 Units by mouth See admin instructions. Take 5000 units at night on Sun, Tues, Thurs, and Sat. Take 10000 units at night on Mon, Wed, and Fri     fluticasone (FLONASE) 50 MCG/ACT nasal spray Place 2 sprays into both nostrils daily. 16 g 6   phentermine (ADIPEX-P) 37.5 MG tablet Take 1 tablet (37.5 mg total) by mouth daily before breakfast. 30 tablet 0   triamcinolone cream (KENALOG) 0.1 % Apply 1 application topically 2 (two) times daily. 80 g 0   trimethoprim-polymyxin b (POLYTRIM) ophthalmic solution Place 1 drop into the left eye every 6 (six) hours. 10 mL 0   No facility-administered medications prior to visit.    Allergies  Allergen Reactions   Amoxicillin-Pot Clavulanate Hives and Nausea And Vomiting   Fluconazole Hives   Penicillins Hives and Nausea And Vomiting    Has patient had a PCN reaction causing immediate rash, facial/tongue/throat swelling, SOB or lightheadedness with hypotension: No Has patient had a PCN reaction causing severe rash involving mucus membranes or skin necrosis: No Has patient had a PCN reaction that required hospitalization: No Has patient had a PCN reaction occurring within the last 10 years: Yes If all of the above answers are "NO",  then may proceed with Cephalosporin use.   Sulfa Antibiotics Hives    Review of Systems  Constitutional:  Negative for chills and fever.  HENT:  Negative for sore throat.        Anterior cervical lymph node enlargement, R>L. Thyroid gland is non tender   Respiratory:  Negative for shortness of breath.   Cardiovascular:  Negative for chest pain, palpitations and leg swelling.  Gastrointestinal:  Negative for abdominal pain, constipation, diarrhea, nausea and vomiting.  Genitourinary:  Negative for dysuria, frequency and urgency.  Skin:  Negative for rash.  Neurological:  Negative for dizziness.       Objective:  Physical Exam  BP 108/62 (BP Location: Left Arm, Patient Position: Sitting, Cuff Size: Large)   Pulse 82   Temp 97.8 F (36.6 C) (Temporal)   Ht 5\' 6"  (1.676 m)   Wt 167 lb (75.8 kg)   SpO2 98%   Breastfeeding No   BMI 26.95 kg/m  Wt Readings from Last 3 Encounters:  01/25/23 167 lb (75.8 kg)  12/14/22 167 lb (75.8 kg)  03/10/22 159 lb 3.2 oz (72.2 kg)       Assessment & Plan:   Problem List Items Addressed This Visit       Endocrine   Hypothyroidism   Relevant Orders   Ambulatory referral to Endocrinology   CBC with Differential/Platelet (Completed)   Basic metabolic panel (Completed)   TSH (Completed)   Other Visit Diagnoses     Pharyngeal dysphagia    -  Primary   Relevant Orders   CT SOFT TISSUE NECK W CONTRAST   CBC with Differential/Platelet (Completed)   Basic metabolic panel (Completed)   Thyroid nodule       Relevant Orders   CT SOFT TISSUE NECK W CONTRAST   Ambulatory referral to Endocrinology   CBC with Differential/Platelet (Completed)   Basic metabolic panel (Completed)   TSH (Completed)   Lymphadenopathy, anterior cervical       Relevant Orders   CT SOFT TISSUE NECK W CONTRAST   CBC with Differential/Platelet (Completed)   Basic metabolic panel (Completed)   Globus sensation       Relevant Orders   CT SOFT TISSUE NECK W  CONTRAST      Reviewed notes from previous thyroid ultrasounds.  No red flag symptoms  Check labs and get CT soft tissue neck to look for underlying etiology including mass or malignancy.   I have discontinued Keilany B. Mcelwain's fluticasone, Biotin, Dialyvite Vitamin D 5000, triamcinolone cream, phentermine, trimethoprim-polymyxin b, benzonatate, and chlorpheniramine-HYDROcodone. I am also having her maintain her acetaminophen, cetirizine, levonorgestrel, ibuprofen, ALPRAZolam, and levothyroxine.  No orders of the defined types were placed in this encounter.

## 2023-01-30 ENCOUNTER — Ambulatory Visit
Admission: RE | Admit: 2023-01-30 | Discharge: 2023-01-30 | Disposition: A | Discharge status: Home / Self Care | Payer: 59 | Source: Ambulatory Visit | Attending: Family Medicine | Admitting: Family Medicine

## 2023-01-30 ENCOUNTER — Other Ambulatory Visit (HOSPITAL_COMMUNITY): Payer: Self-pay

## 2023-01-30 DIAGNOSIS — R09A2 Foreign body sensation, throat: Secondary | ICD-10-CM

## 2023-01-30 DIAGNOSIS — R1313 Dysphagia, pharyngeal phase: Secondary | ICD-10-CM

## 2023-01-30 DIAGNOSIS — E041 Nontoxic single thyroid nodule: Secondary | ICD-10-CM | POA: Diagnosis not present

## 2023-01-30 DIAGNOSIS — R590 Localized enlarged lymph nodes: Secondary | ICD-10-CM

## 2023-01-30 MED ORDER — IOPAMIDOL (ISOVUE-300) INJECTION 61%
100.0000 mL | Freq: Once | INTRAVENOUS | Status: AC | PRN
  Administered 2023-01-30: 100 mL via INTRAVENOUS

## 2023-02-06 ENCOUNTER — Other Ambulatory Visit (HOSPITAL_COMMUNITY): Payer: Self-pay

## 2023-02-12 ENCOUNTER — Encounter: Payer: Self-pay | Admitting: Nurse Practitioner

## 2023-03-19 NOTE — Patient Instructions (Signed)

## 2023-03-21 ENCOUNTER — Ambulatory Visit (INDEPENDENT_AMBULATORY_CARE_PROVIDER_SITE_OTHER): Payer: 59 | Admitting: Nurse Practitioner

## 2023-03-21 ENCOUNTER — Encounter: Payer: Self-pay | Admitting: Nurse Practitioner

## 2023-03-21 VITALS — BP 99/69 | HR 91 | Ht 66.0 in | Wt 167.0 lb

## 2023-03-21 DIAGNOSIS — E063 Autoimmune thyroiditis: Secondary | ICD-10-CM | POA: Diagnosis not present

## 2023-03-21 DIAGNOSIS — E038 Other specified hypothyroidism: Secondary | ICD-10-CM

## 2023-03-21 DIAGNOSIS — E559 Vitamin D deficiency, unspecified: Secondary | ICD-10-CM | POA: Diagnosis not present

## 2023-03-21 DIAGNOSIS — R5382 Chronic fatigue, unspecified: Secondary | ICD-10-CM | POA: Diagnosis not present

## 2023-03-21 NOTE — Progress Notes (Signed)
Endocrinology Consult Note                                         03/21/2023, 2:48 PM  Subjective:   Subjective    Allison Hicks is a 37 y.o.-year-old female patient being seen in consultation for hypothyroidism referred by Myrlene Broker, MD.   Past Medical History:  Diagnosis Date   Allergy    Asthma    Headache    Migraines     Past Surgical History:  Procedure Laterality Date   WISDOM TOOTH EXTRACTION      Social History   Socioeconomic History   Marital status: Married    Spouse name: Not on file   Number of children: 3   Years of education: Not on file   Highest education level: Not on file  Occupational History   Not on file  Tobacco Use   Smoking status: Never   Smokeless tobacco: Never  Vaping Use   Vaping Use: Never used  Substance and Sexual Activity   Alcohol use: No    Comment: Social   Drug use: No   Sexual activity: Yes    Birth control/protection: Pill  Other Topics Concern   Not on file  Social History Narrative   Not on file   Social Determinants of Health   Financial Resource Strain: Patient Declined (01/25/2023)   Overall Financial Resource Strain (CARDIA)    Difficulty of Paying Living Expenses: Patient declined  Food Insecurity: Patient Declined (01/25/2023)   Hunger Vital Sign    Worried About Running Out of Food in the Last Year: Patient declined    Ran Out of Food in the Last Year: Patient declined  Transportation Needs: Patient Declined (01/25/2023)   PRAPARE - Administrator, Civil Service (Medical): Patient declined    Lack of Transportation (Non-Medical): Patient declined  Physical Activity: Insufficiently Active (01/25/2023)   Exercise Vital Sign    Days of Exercise per Week: 1 day    Minutes of Exercise per Session: 10 min  Stress: Stress Concern Present (01/25/2023)   [employer] of Occupational Health - Occupational Stress  Questionnaire    Feeling of Stress : To some extent  Social Connections: Unknown (01/25/2023)   Social Connection and Isolation Panel [NHANES]    Frequency of Communication with Friends and Family: Patient declined    Frequency of Social Gatherings with Friends and Family: Patient declined    Attends Religious Services: Patient declined    Database administrator or Organizations: Yes    Attends Banker Meetings: Patient declined    Marital Status: Married    Family History  Problem Relation Age of Onset   Breast cancer Mother    Thyroid disease Sister    Other Brother        brain tumor   Brain cancer Brother    Thyroid disease Maternal Grandmother    Diabetes Maternal Grandfather    Hyperlipidemia Paternal Grandfather    Parkinson's  disease Paternal Grandfather    Diabetes Maternal Aunt    Breast cancer Maternal Aunt    Thyroid disease Maternal Aunt    Breast cancer Maternal Aunt     Outpatient Encounter Medications as of 03/21/2023  Medication Sig   acetaminophen (TYLENOL) 500 MG tablet Take 500 mg by mouth daily as needed for headache.   ALPRAZolam (XANAX) 0.25 MG tablet Take 1 tablet (0.25 mg total) by mouth daily as needed for anxiety.   cetirizine (ZYRTEC) 10 MG tablet Take 10 mg by mouth at bedtime.   ibuprofen (ADVIL) 200 MG tablet Take 400 mg by mouth daily as needed for headache.   levonorgestrel (MIRENA) 20 MCG/24HR IUD 1 each by Intrauterine route once.   levothyroxine (SYNTHROID) 75 MCG tablet Take 1 tablet (75 mcg total) by mouth daily before breakfast.   VITAMIN D, CHOLECALCIFEROL, PO Take 1,000 Units by mouth at bedtime as needed. This is gel form.   No facility-administered encounter medications on file as of 03/21/2023.    ALLERGIES: Allergies  Allergen Reactions   Amoxicillin-Pot Clavulanate Hives and Nausea And Vomiting   Fluconazole Hives   Penicillins Hives and Nausea And Vomiting    Has patient had a PCN reaction causing immediate rash,  facial/tongue/throat swelling, SOB or lightheadedness with hypotension: No Has patient had a PCN reaction causing severe rash involving mucus membranes or skin necrosis: No Has patient had a PCN reaction that required hospitalization: No Has patient had a PCN reaction occurring within the last 10 years: Yes If all of the above answers are "NO", then may proceed with Cephalosporin use.   Sulfa Antibiotics Hives   VACCINATION STATUS: Immunization History  Administered Date(s) Administered   Influenza,inj,Quad PF,6+ Mos 07/20/2017   Influenza-Unspecified 08/20/2018   Moderna Sars-Covid-2 Vaccination 11/05/2019, 11/25/2019   Tdap 11/29/2012, 07/07/2017     HPI   Allison Hicks  is a patient with the above medical history. she was diagnosed with hypothyroidism at approximate age of 33 years, which required subsequent initiation of thyroid hormone replacement therapy. she was given various doses of Levothyroxine over the years, currently on 75 micrograms. she reports compliance to this medication:  Taking it daily on empty stomach with water, separated by >30 minutes before breakfast and other medications, and by at least 4 hours from calcium, iron, PPIs, multivitamins .  I reviewed patient's thyroid tests:  Lab Results  Component Value Date   TSH 1.43 01/25/2023   TSH 2.43 03/22/2022   TSH 0.95 03/24/2021   TSH 4.29 06/24/2019   TSH 6.49 (H) 12/16/2018   TSH 18.64 (H) 02/01/2018   FREET4 1.04 03/22/2022   FREET4 1.01 03/24/2021   FREET4 0.78 12/16/2018   FREET4 0.71 02/01/2018     Pt describes: - fatigue - cold intolerance - constipation - dry skin  Pt denies feeling nodules in neck, hoarseness, dysphagia/odynophagia, SOB with lying down.  She does have intermittent fullness in throat (mainly right side) which comes and goes and globus sensation.  she does have extensive family history of thyroid disorders in her aunt (Graves disease requiring RAI), grandma (thyroidectomy for  ?Graves disease), sister (hypothyroidism).  No family history of thyroid cancer.  No history of radiation therapy to head or neck.  No recent use of iodine supplements.  Denies use of Biotin containing supplements.  I reviewed her chart and she also has a history of vitamin d deficiency.   ROS:  Constitutional: no weight gain/loss, + fatigue, no subjective hyperthermia, + subjective hypothermia  Eyes: no blurry vision, no xerophthalmia ENT: no sore throat, no nodules palpated in throat, + intermittent dysphagia, + globus sensation, no hoarseness Cardiovascular: no chest pain, no SOB, no palpitations, no leg swelling Respiratory: no cough, no SOB Gastrointestinal: no nausea/vomiting/diarrhea, + intermittent constipation Musculoskeletal: + muscle/joint aches Skin: no rashes Neurological: no tremors, no numbness, no tingling, no dizziness Psychiatric: no depression, no anxiety   Objective:   Objective     BP 99/69 (BP Location: Left Arm, Patient Position: Sitting, Cuff Size: Large)   Pulse 91   Ht 5\' 6"  (1.676 m)   Wt 167 lb (75.8 kg)   BMI 26.95 kg/m  Wt Readings from Last 3 Encounters:  03/21/23 167 lb (75.8 kg)  01/25/23 167 lb (75.8 kg)  12/14/22 167 lb (75.8 kg)    BP Readings from Last 3 Encounters:  03/21/23 99/69  01/25/23 108/62  12/14/22 106/70     Constitutional:  Body mass index is 26.95 kg/m., not in acute distress, normal state of mind Eyes: PERRLA, EOMI, no exophthalmos ENT: moist mucous membranes, + thyromegaly R>L, no palpable nodularity, no cervical lymphadenopathy Cardiovascular: normal precordial activity, RRR, no murmur/rubs/gallops Respiratory:  adequate breathing efforts, no gross chest deformity, Clear to auscultation bilaterally Musculoskeletal: no gross deformities, strength intact in all four extremities Skin: moist, warm, no rashes Neurological: no tremor with outstretched hands, deep tendon reflexes normal in BLE.   CMP ( most  recent) CMP     Component Value Date/Time   NA 139 01/25/2023 1019   NA 139 06/24/2019 0000   K 4.4 01/25/2023 1019   CL 104 01/25/2023 1019   CO2 29 01/25/2023 1019   GLUCOSE 91 01/25/2023 1019   BUN 7 01/25/2023 1019   BUN 11 06/24/2019 0000   CREATININE 0.76 01/25/2023 1019   CALCIUM 10.1 01/25/2023 1019   PROT 7.0 03/22/2022 0732   ALBUMIN 4.6 03/22/2022 0732   AST 17 03/22/2022 0732   ALT 12 03/22/2022 0732   ALKPHOS 51 03/22/2022 0732   BILITOT 0.6 03/22/2022 0732     Diabetic Labs (most recent): Lab Results  Component Value Date   HGBA1C 5.0 03/22/2022   HGBA1C 5.4 03/25/2021     Lipid Panel ( most recent) Lipid Panel     Component Value Date/Time   CHOL 160 03/22/2022 0732   TRIG 59.0 03/22/2022 0732   HDL 53.90 03/22/2022 0732   CHOLHDL 3 03/22/2022 0732   VLDL 11.8 03/22/2022 0732   LDLCALC 95 03/22/2022 0732       Lab Results  Component Value Date   TSH 1.43 01/25/2023   TSH 2.43 03/22/2022   TSH 0.95 03/24/2021   TSH 4.29 06/24/2019   TSH 6.49 (H) 12/16/2018   TSH 18.64 (H) 02/01/2018   FREET4 1.04 03/22/2022   FREET4 1.01 03/24/2021   FREET4 0.78 12/16/2018   FREET4 0.71 02/01/2018    Thyroid US from 12/23/18 CLINICAL DATA:  Goiter.  Short of breath.   EXAM: THYROID ULTRASOUND   TECHNIQUE: Ultrasound examination of the thyroid gland and adjacent soft tissues was performed.   COMPARISON:  None.   FINDINGS: Parenchymal Echotexture: Markedly heterogenous   Isthmus: 0.6 cm   Right lobe: 6.3 x 1.5 x 2.5 cm   Left lobe: 5.3 x 1.4 x 2.1 cm   _________________________________________________________   Estimated total number of nodules >/= 1 cm: 2   Number of spongiform nodules >/=  2 cm not described below (TR1): 0   Number of mixed cystic and  solid nodules >/= 1.5 cm not described below (TR2): 0   _________________________________________________________   Nodule 1 in the lower pole is 1.0 cm and is isoechoic. It does  not meet criteria for biopsy nor follow-up.   Nodule versus pseudo nodule # 2:   Location: Right; Inferior   Maximum size: 1.6 cm; Other 2 dimensions: 1.4 x 0.7 cm   Composition: solid/almost completely solid (2)   Echogenicity: isoechoic (1)   Shape: not taller-than-wide (0)   Margins: ill-defined (0)   Echogenic foci: none (0)   ACR TI-RADS total points: 3.   ACR TI-RADS risk category: TR3 (3 points).   ACR TI-RADS recommendations:   *Given size (>/= 1.5 - 2.4 cm) and appearance, a follow-up ultrasound in 1 year should be considered based on TI-RADS criteria. It is difficult to determine if this is within the gland or posterior to the lower pole of the right lobe and external to the gland.   _________________________________________________________   IMPRESSION: Right nodule 1 does not meet criteria for biopsy nor follow-up.   Right nodule versus pseudo nodule 2 meets criteria for annual follow-up. It is difficult to determine if this is within the gland or external to the gland and posterior to the lower pole such as a parathyroid mass.   The above is in keeping with the ACR TI-RADS recommendations - J Am Coll Radiol 2017;14:587-595.     Electronically Signed   By: Jolaine Click M.D.   On: 12/23/2018 15:21 -------------------------------------------------------------------------------------------------------------------------------------  Thyroid US from 12/26/19 CLINICAL DATA:  Thyroid nodule follow-up   EXAM: THYROID ULTRASOUND   TECHNIQUE: Ultrasound examination of the thyroid gland and adjacent soft tissues was performed.   COMPARISON:  12/23/2018   FINDINGS: Parenchymal Echotexture: Markedly heterogenous   Isthmus: 0.6 cm   Right lobe: 5.9 x 1.4 x 2 cm   Left lobe: 4.9 x 1.5 x 1.6 cm   _________________________________________________________   Estimated total number of nodules >/= 1 cm: 0   Number of spongiform nodules >/=  2 cm not  described below (TR1): 0   Number of mixed cystic and solid nodules >/= 1.5 cm not described below (TR2): 0   _________________________________________________________   Nodule # 1:   Prior biopsy: No   Location: Right; Mid   Maximum size: 0.9 cm; Other 2 dimensions: 0.8 x 0.5 cm, previously, 1 x 0.9 x 0.5 cm   Composition: solid/almost completely solid (2)   Echogenicity: hypoechoic (2)   Shape: not taller-than-wide (0)   Margins: smooth (0)   Echogenic foci: none (0)   ACR TI-RADS total points: 4.   ACR TI-RADS risk category:  TR4 (4-6 points).   Significant change in size (>/= 20% in two dimensions and minimal increase of 2 mm): No   Change in features: No   Change in ACR TI-RADS risk category: No   ACR TI-RADS recommendations:   Given size (<0.9 cm) and appearance, this nodule does NOT meet TI-RADS criteria for biopsy or dedicated follow-up.   _________________________________________________________   There are no concerning cervical lymph nodes identified on today's study.   IMPRESSION: 1. Markedly heterogeneous enlarged thyroid gland as detailed above. 2. Slight interval decrease in size of the dominant thyroid nodule in the right mid thyroid gland. This thyroid nodule no longer meets criteria for follow-up or FNA.   The above is in keeping with the ACR TI-RADS recommendations - J Am Coll Radiol 2017;14:587-595.     Electronically Signed   By: Katherine Mantle  M.D.   On: 12/26/2019 18:46 ------------------------------------------------------------------------------------------------------------------------------------  CT neck from 01/30/23 CLINICAL DATA:  Thyroid nodule pharyngeal dysphagia, globus sensation, left anterior cervical lymphadenopathy.   EXAM: CT NECK WITH CONTRAST   TECHNIQUE: Multidetector CT imaging of the neck was performed using the standard protocol following the bolus administration of intravenous contrast.    RADIATION DOSE REDUCTION: This exam was performed according to the departmental dose-optimization program which includes automated exposure control, adjustment of the mA and/or kV according to patient size and/or use of iterative reconstruction technique.   CONTRAST:  ISOVUE-300 IOPAMIDOL (ISOVUE-300) INJECTION 61%   COMPARISON:  Thyroid ultrasound 12/26/2019.   FINDINGS: Pharynx and larynx: Glottis is partially closed.  No mass or edema.   Salivary glands: No inflammation, mass, or stone.   Thyroid: Unchanged heterogeneous enlargement without discrete nodule by CT.   Lymph nodes: No suspicious cervical lymphadenopathy.   Vascular: Normal.   Limited intracranial: Normal.   Visualized orbits: Not visualized.   Mastoids and visualized paranasal sinuses: Well aerated.   Skeleton: Normal.   Upper chest: Normal.   Other: None.   IMPRESSION: 1. No mass or suspicious lymphadenopathy in the neck. 2. Unchanged heterogeneous enlargement of the thyroid gland without discrete nodule by CT.     Electronically Signed   By: Orvan Falconer M.D.   On: 02/01/2023 13:16    Latest Reference Range & Units 02/01/18 10:32 12/16/18 14:42 06/24/19 00:00 03/24/21 07:32 03/22/22 07:32 01/25/23 10:19  TSH 0.35 - 5.50 uIU/mL 18.64 (H) 6.49 (H) 4.29 (E) 0.95 2.43 1.43  T4,Free(Direct) 0.60 - 1.60 ng/dL 5.62 1.30  8.65 7.84   Thyroglobulin Ab < or = 1 IU/mL  >1,000 (H)      Thyroperoxidase Ab SerPl-aCnc <9 IU/mL  >900 (H)      (H): Data is abnormally high (E): External lab result  Assessment & Plan:   ASSESSMENT / PLAN:  1. Hypothyroidism- r/t Hashimoto's thyroiditis 2. Vitamin D Deficiency 3. Chronic fatigue   Patient with long-standing hypothyroidism, on levothyroxine therapy. On physical exam, patient does not  have gross goiter, thyroid nodules, or neck compression symptoms.  While her thyroid is slightly enlarged, I do not feel a repeat thyroid ultrasound is warranted at  this time.  I did review her previous ultrasound reports from 2020 and 2021 along with recent CT scan of her neck which shows slight decrease in size, consistent with chronic thyroiditis.  She is advised to continue Levothyroxine 75 mcg po daily before breakfast.  Will recheck TFTs today, along with B12, Vitamin D, CMP given chronic fatigue and vitamin d deficiency.  - We discussed about correct intake of levothyroxine, at fasting, with water, separated by at least 30 minutes from breakfast, and separated by more than 4 hours from calcium, iron, multivitamins, acid reflux medications (PPIs). -Patient is made aware of the fact that thyroid hormone replacement is needed for life, dose to be adjusted by periodic monitoring of thyroid function tests.    - Time spent with the patient: 45 minutes, of which >50% was spent in obtaining information about her symptoms, reviewing her previous labs, evaluations, and treatments, counseling her about her hypothyroidism, and developing a plan to confirm the diagnosis and long term treatment as necessary. Please refer to "Patient Self Inventory" in the Media tab for reviewed elements of pertinent patient history.  Deno Etienne participated in the discussions, expressed understanding, and voiced agreement with the above plans.  All questions were answered to her satisfaction. she is encouraged  to contact clinic should she have any questions or concerns prior to her return visit.   FOLLOW UP PLAN:  Return if symptoms worsen or fail to improve.  Ronny Bacon, [facility] [address] [place], [place] [address] Phone: [phone] Fax: [phone]  03/21/2023, 2:48 PM

## 2023-03-22 ENCOUNTER — Encounter: Payer: Self-pay | Admitting: Nurse Practitioner

## 2023-03-22 ENCOUNTER — Other Ambulatory Visit (HOSPITAL_COMMUNITY): Payer: Self-pay

## 2023-03-22 ENCOUNTER — Other Ambulatory Visit: Payer: Self-pay | Admitting: Nurse Practitioner

## 2023-03-22 DIAGNOSIS — E038 Other specified hypothyroidism: Secondary | ICD-10-CM

## 2023-03-22 LAB — COMPREHENSIVE METABOLIC PANEL
ALT: 18 IU/L (ref 0–32)
AST: 21 IU/L (ref 0–40)
Albumin/Globulin Ratio: 2 (ref 1.2–2.2)
Albumin: 4.7 g/dL (ref 3.9–4.9)
Alkaline Phosphatase: 66 IU/L (ref 44–121)
BUN/Creatinine Ratio: 10 (ref 9–23)
BUN: 8 mg/dL (ref 6–20)
Bilirubin Total: 0.4 mg/dL (ref 0.0–1.2)
CO2: 26 mmol/L (ref 20–29)
Calcium: 10.1 mg/dL (ref 8.7–10.2)
Chloride: 100 mmol/L (ref 96–106)
Creatinine, Ser: 0.79 mg/dL (ref 0.57–1.00)
Globulin, Total: 2.3 g/dL (ref 1.5–4.5)
Glucose: 83 mg/dL (ref 70–99)
Potassium: 5.2 mmol/L (ref 3.5–5.2)
Sodium: 139 mmol/L (ref 134–144)
Total Protein: 7 g/dL (ref 6.0–8.5)
eGFR: 98 mL/min/{1.73_m2} (ref >59)

## 2023-03-22 LAB — T4, FREE: Free T4: 1.57 ng/dL (ref 0.82–1.77)

## 2023-03-22 LAB — TSH: TSH: 2.52 u[IU]/mL (ref 0.450–4.500)

## 2023-03-22 LAB — VITAMIN D 25 HYDROXY (VIT D DEFICIENCY, FRACTURES): Vit D, 25-Hydroxy: 63.1 ng/mL (ref 30.0–100.0)

## 2023-03-22 LAB — VITAMIN B12: Vitamin B-12: 488 pg/mL (ref 232–1245)

## 2023-03-22 MED ORDER — LEVOTHYROXINE SODIUM 88 MCG PO TABS
88.0000 ug | ORAL_TABLET | Freq: Every day | ORAL | 1 refills | Status: DC
  Filled 2023-03-22: qty 90, 90d supply, fill #0
  Filled 2023-06-17: qty 90, 90d supply, fill #1

## 2023-03-22 NOTE — Addendum Note (Signed)
Addended by: Dani Gobble on: 03/22/2023 07:13 AM   Modules accepted: Orders

## 2023-03-22 NOTE — Progress Notes (Signed)
Noted  

## 2023-04-20 ENCOUNTER — Encounter: Payer: Self-pay | Admitting: Internal Medicine

## 2023-04-20 ENCOUNTER — Other Ambulatory Visit (HOSPITAL_COMMUNITY): Payer: Self-pay

## 2023-04-20 ENCOUNTER — Ambulatory Visit (INDEPENDENT_AMBULATORY_CARE_PROVIDER_SITE_OTHER): Payer: 59 | Admitting: Internal Medicine

## 2023-04-20 VITALS — BP 100/80 | HR 76 | Temp 98.2°F | Ht 66.0 in | Wt 165.0 lb

## 2023-04-20 DIAGNOSIS — E063 Autoimmune thyroiditis: Secondary | ICD-10-CM | POA: Diagnosis not present

## 2023-04-20 DIAGNOSIS — F418 Other specified anxiety disorders: Secondary | ICD-10-CM

## 2023-04-20 DIAGNOSIS — Z803 Family history of malignant neoplasm of breast: Secondary | ICD-10-CM | POA: Diagnosis not present

## 2023-04-20 DIAGNOSIS — E038 Other specified hypothyroidism: Secondary | ICD-10-CM

## 2023-04-20 DIAGNOSIS — G43B0 Ophthalmoplegic migraine, not intractable: Secondary | ICD-10-CM | POA: Diagnosis not present

## 2023-04-20 DIAGNOSIS — Z0000 Encounter for general adult medical examination without abnormal findings: Secondary | ICD-10-CM

## 2023-04-20 MED ORDER — ALPRAZOLAM 0.25 MG PO TABS
0.2500 mg | ORAL_TABLET | Freq: Every day | ORAL | 2 refills | Status: DC | PRN
  Filled 2023-04-20: qty 30, 30d supply, fill #0

## 2023-04-20 NOTE — Assessment & Plan Note (Signed)
Mom recently passed away of breast cancer. Coping well refilled alprazolam which she uses rarely.

## 2023-04-20 NOTE — Progress Notes (Signed)
   Subjective:   Patient ID: Allison Hicks, female    DOB: [birth year], 37 y.o.   MRN: [id number]  HPI The patient is here for physical.  PMH, [facility], social history reviewed and updated  Review of Systems  Constitutional: Negative.   HENT: Negative.    Eyes: Negative.   Respiratory:  Negative for cough, chest tightness and shortness of breath.   Cardiovascular:  Negative for chest pain, palpitations and leg swelling.  Gastrointestinal:  Negative for abdominal distention, abdominal pain, constipation, diarrhea, nausea and vomiting.  Musculoskeletal: Negative.   Skin: Negative.   Neurological: Negative.   Psychiatric/Behavioral: Negative.      Objective:  Physical Exam Constitutional:      Appearance: She is well-developed.  HENT:     Head: Normocephalic and atraumatic.  Cardiovascular:     Rate and Rhythm: Normal rate and regular rhythm.  Pulmonary:     Effort: Pulmonary effort is normal. No respiratory distress.     Breath sounds: Normal breath sounds. No wheezing or rales.  Abdominal:     General: Bowel sounds are normal. There is no distension.     Palpations: Abdomen is soft.     Tenderness: There is no abdominal tenderness. There is no rebound.  Musculoskeletal:     Cervical back: Normal range of motion.  Skin:    General: Skin is warm and dry.  Neurological:     Mental Status: She is alert and oriented to person, place, and time.     Coordination: Coordination normal.     Vitals:   04/20/23 1502  BP: 100/80  Pulse: 76  Temp: 98.2 F (36.8 C)  TempSrc: Oral  SpO2: 99%  Weight: 165 lb (74.8 kg)  Height: 5\' 6"  (1.676 m)    Assessment & Plan:

## 2023-04-20 NOTE — Patient Instructions (Signed)
[place] attention specialist

## 2023-04-20 NOTE — Assessment & Plan Note (Signed)
Flu shot yearly. Tetanus up to date. Mammogram up to date, pap smear up to date with gyn. Counseled about sun safety and mole surveillance. Counseled about the dangers of distracted driving. Given 10 year screening recommendations.

## 2023-04-20 NOTE — Assessment & Plan Note (Signed)
Not having a lot recently.

## 2023-04-20 NOTE — Assessment & Plan Note (Signed)
Recent labs stable taking synthroid 88 mcg daily.

## 2023-04-25 ENCOUNTER — Telehealth: Payer: Self-pay | Admitting: Internal Medicine

## 2023-04-25 NOTE — Telephone Encounter (Signed)
Left a message regarding genetics counselor, and also call back number.

## 2023-05-02 ENCOUNTER — Telehealth: Payer: Self-pay | Admitting: Genetic Counselor

## 2023-05-02 NOTE — Telephone Encounter (Signed)
Patient is aware of upcoming appointment dates/times.  

## 2023-05-15 DIAGNOSIS — D171 Benign lipomatous neoplasm of skin and subcutaneous tissue of trunk: Secondary | ICD-10-CM | POA: Diagnosis not present

## 2023-06-12 ENCOUNTER — Inpatient Hospital Stay: Payer: 59

## 2023-06-12 ENCOUNTER — Other Ambulatory Visit: Payer: Self-pay

## 2023-06-12 ENCOUNTER — Inpatient Hospital Stay: Payer: 59 | Attending: Genetic Counselor | Admitting: Genetic Counselor

## 2023-06-12 DIAGNOSIS — Z808 Family history of malignant neoplasm of other organs or systems: Secondary | ICD-10-CM

## 2023-06-12 DIAGNOSIS — Z803 Family history of malignant neoplasm of breast: Secondary | ICD-10-CM

## 2023-06-13 ENCOUNTER — Encounter: Payer: Self-pay | Admitting: Genetic Counselor

## 2023-06-13 DIAGNOSIS — Z808 Family history of malignant neoplasm of other organs or systems: Secondary | ICD-10-CM | POA: Insufficient documentation

## 2023-06-13 DIAGNOSIS — Z803 Family history of malignant neoplasm of breast: Secondary | ICD-10-CM | POA: Insufficient documentation

## 2023-06-13 NOTE — Progress Notes (Signed)
REFERRING PROVIDER: Myrlene Broker, MD [address] [place],  [place] [address]  PRIMARY PROVIDER:  Myrlene Broker, MD  PRIMARY REASON FOR VISIT:  1. Family history of breast cancer   2. Family history of glioblastoma      HISTORY OF PRESENT ILLNESS:   Allison Hicks, a 37 y.o. female, was seen for a [place] cancer genetics consultation at the request of Dr. Okey Dupre due to a family history of breast cancer.  Allison Hicks presents to clinic today to discuss the possibility of a hereditary predisposition to cancer, genetic testing, and to further clarify her future cancer risks, as well as potential cancer risks for family members.   Allison Hicks is a 37 y.o. female with no personal history of cancer.    CANCER HISTORY:  Oncology History   No history exists.     RISK FACTORS:  Menarche was at age 15.  First live birth at age 39.  OCP use for approximately 10 years.  Ovaries intact: yes.  Hysterectomy: no.  Menopausal status: premenopausal.  HRT use: 0 years. Colonoscopy: no; not examined. Mammogram within the last year: yes. Number of breast biopsies: 1. Up to date with pelvic exams: yes. Any excessive radiation exposure in the past: no  Past Medical History:  Diagnosis Date   Allergy    Asthma    Family history of breast cancer 06/13/2023   Family history of glioblastoma    Headache    Migraines     Past Surgical History:  Procedure Laterality Date   WISDOM TOOTH EXTRACTION      Social History   Socioeconomic History   Marital status: Married    Spouse name: Not on file   Number of children: 3   Years of education: Not on file   Highest education level: Not on file  Occupational History   Not on file  Tobacco Use   Smoking status: Never   Smokeless tobacco: Never  Vaping Use   Vaping status: Never Used  Substance and Sexual Activity   Alcohol use: No    Comment: Social   Drug use: No   Sexual activity: Yes    Birth  control/protection: Pill  Other Topics Concern   Not on file  Social History Narrative   Not on file   Social Determinants of Health   Financial Resource Strain: Patient Declined (01/25/2023)   Overall Financial Resource Strain (CARDIA)    Difficulty of Paying Living Expenses: Patient declined  Food Insecurity: Patient Declined (01/25/2023)   Hunger Vital Sign    Worried About Running Out of Food in the Last Year: Patient declined    Ran Out of Food in the Last Year: Patient declined  Transportation Needs: Patient Declined (01/25/2023)   PRAPARE - Administrator, Civil Service (Medical): Patient declined    Lack of Transportation (Non-Medical): Patient declined  Physical Activity: Insufficiently Active (01/25/2023)   Exercise Vital Sign    Days of Exercise per Week: 1 day    Minutes of Exercise per Session: 10 min  Stress: Stress Concern Present (01/25/2023)   [employer] of Occupational Health - Occupational Stress Questionnaire    Feeling of Stress : To some extent  Social Connections: Unknown (01/25/2023)   Social Connection and Isolation Panel [NHANES]    Frequency of Communication with Friends and Family: Patient declined    Frequency of Social Gatherings with Friends and Family: Patient declined    Attends Religious Services: Patient declined  Active Member of Clubs or Organizations: Yes    Attends Banker Meetings: Patient declined    Marital Status: Married     FAMILY HISTORY:  We obtained a detailed, 4-generation family history.  Significant diagnoses are listed below: Family History  Problem Relation Age of Onset   Breast cancer Mother 32   Thyroid disease Sister    Brain cancer Brother 10       Glioblastoma, d. 14   Down syndrome Brother    Diabetes Maternal Aunt    Breast cancer Maternal Aunt 92   Thyroid disease Maternal Aunt    Breast cancer Maternal Aunt 81   Other Paternal Uncle        MVA   Thyroid disease Maternal  Grandmother    Diabetes Maternal Grandfather    Hyperlipidemia Paternal Grandfather    Parkinson's disease Paternal Grandfather    Brain cancer Cousin        dx. in her 20s-30s, benign brain tumor, ?? Meningioma.  reportedly neg GT     The patient has a son and two daughters who are cancer free.  She has two sisters and two brothers.  One brother died at 32 from glioblastoma.  Her mother is deceased and father is living.  The patient's father is healthy and 22.  He has a brother and two sisters, none reported to have cancer.  The paternal grandmother is living and the grandfather is deceased.  The patient's mother died at 77 from breast cancer.  She has two sisters who both had breast cancer in their 42's. One sister had a daughter with what sound like a meningioma. This person had genetic testing and it was reportedly negative.  The maternal grandparents are deceased.  Allison Hicks is aware of previous family history of genetic testing for hereditary cancer risks. There is no reported Ashkenazi Jewish ancestry. There is no known consanguinity.  GENETIC COUNSELING ASSESSMENT: Allison Hicks is a 37 y.o. female with a family history of breast cancer which is somewhat suggestive of a hereditary cancer syndrome and predisposition to cancer given the number of women with breast cancer. We, therefore, discussed and recommended the following at today's visit.   DISCUSSION: We discussed that, in general, most cancer is not inherited in families, but instead is sporadic or familial. Sporadic cancers occur by chance and typically happen at older ages (>50 years) as this type of cancer is caused by genetic changes acquired during an individual's lifetime. Some families have more cancers than would be expected by chance; however, the ages or types of cancer are not consistent with a known genetic mutation or known genetic mutations have been ruled out. This type of familial cancer is thought to be due to a  combination of multiple genetic, environmental, hormonal, and lifestyle factors. While this combination of factors likely increases the risk of cancer, the exact source of this risk is not currently identifiable or testable.  We discussed that 5 - 10% of breast cancer is hereditary, with most cases associated with BRCA mutations.  There are other genes that can be associated with hereditary breast cancer syndromes.  These include ATM, CHEK2 and PALB2.  Typically, these genes do not increase the risk for young glioblastoma.  While the concern for a young glioblastoma would be for a TP53 mutation, the family history of breast cancer in the 60's is not consistent with that diagnosis.  We discussed that testing is beneficial for several reasons including knowing how to follow  individuals after completing their treatment, identifying whether potential treatment options such as PARP inhibitors would be beneficial, and understand if other family members could be at risk for cancer and allow them to undergo genetic testing.   We reviewed the characteristics, features and inheritance patterns of hereditary cancer syndromes. We also discussed genetic testing, including the appropriate family members to test, the process of testing, insurance coverage and turn-around-time for results. We discussed the implications of a negative, positive, carrier and/or variant of uncertain significant result. Allison Hicks  was offered a common hereditary cancer panel (47 genes) and an expanded pan-cancer panel (77 genes). Allison Hicks was informed of the benefits and limitations of each panel, including that expanded pan-cancer panels contain genes that do not have clear management guidelines at this point in time.  We also discussed that as the number of genes included on a panel increases, the chances of variants of uncertain significance increases. Allison Hicks decided to pursue genetic testing for the CancerNext-Expanded+RNAinsight gene  panel.   The CancerNext-Expanded gene panel offered by [facility] and includes sequencing and rearrangement analysis for the following 77 genes: AIP, ALK, APC*, ATM*, AXIN2, BAP1, BARD1, BMPR1A, BRCA1*, BRCA2*, BRIP1*, CDC73, CDH1*, CDK4, CDKN1B, CDKN2A, CHEK2*, CTNNA1, DICER1, FH, FLCN, KIF1B, LZTR1, MAX, MEN1, MET, MLH1*, MSH2*, MSH3, MSH6*, MUTYH*, NF1*, NF2, NTHL1, PALB2*, PHOX2B, PMS2*, POT1, PRKAR1A, PTCH1, PTEN*, RAD51C*, RAD51D*, RB1, RET, SDHA, SDHAF2, SDHB, SDHC, SDHD, SMAD4, SMARCA4, SMARCB1, SMARCE1, STK11, SUFU, TMEM127, TP53*, TSC1, TSC2, and VHL (sequencing and deletion/duplication); EGFR, EGLN1, HOXB13, KIT, MITF, PDGFRA, POLD1, and POLE (sequencing only); EPCAM and GREM1 (deletion/duplication only). DNA and RNA analyses performed for * genes.   Based on Allison Hicks's family history of cancer, she meets medical criteria for genetic testing. Despite that she meets criteria, she may still have an out of pocket cost. We discussed that if her out of pocket cost for testing is over $100, the laboratory will call and confirm whether she wants to proceed with testing.  If the out of pocket cost of testing is less than $100 she will be billed by the genetic testing laboratory.   We discussed that some people do not want to undergo genetic testing due to fear of genetic discrimination.  The Genetic Information Nondiscrimination Act (GINA) was signed into federal law in 2008. GINA prohibits health insurers and most employers from discriminating against individuals based on genetic information (including the results of genetic tests and family history information). According to GINA, health insurance companies cannot consider genetic information to be a preexisting condition, nor can they use it to make decisions regarding coverage or rates. GINA also makes it illegal for most employers to use genetic information in making decisions about hiring, firing, promotion, or terms of employment. It is  important to note that GINA does not offer protections for life insurance, disability insurance, or long-term care insurance. GINA does not apply to those in the [employer], those who work for companies with less than 15 employees, and new life insurance or long-term disability insurance policies.  Health status due to a cancer diagnosis is not protected under GINA. More information about GINA can be found by visiting [email or web address].   PLAN: Allison Hicks did not wish to pursue genetic testing at today's visit. She wants to discuss things with her husband and will call back if she decides to pursue genetic testing. We understand this decision and remain available to coordinate genetic testing at any time in the future. We, therefore, recommend Allison Hicks  continue to follow the cancer screening guidelines given by her primary healthcare provider.  Based on Allison Hicks's family history, we recommended her maternal aunts, who were diagnosed with breast in their 51's, have genetic counseling and testing. Allison Hicks will let us know if we can be of any assistance in coordinating genetic counseling and/or testing for this family member.   Lastly, we encouraged Allison Hicks to remain in contact with cancer genetics annually so that we can continuously update the family history and inform her of any changes in cancer genetics and testing that may be of benefit for this family.   Allison Hicks questions were answered to her satisfaction today. Our contact information was provided should additional questions or concerns arise. Thank you for the referral and allowing [place] to share in the care of your patient.    P. Lowell Guitar, MS, [facility] Licensed, Patent attorney [email or web address] phone: [phone]  The patient was seen for a total of 35 minutes in face-to-face genetic counseling.  The patient was seen alone.  Drs. Meliton Rattan, and/or [place] were available for questions, if needed..     _______________________________________________________________________ For Office Staff:  Number of people involved in session: 1 Was an Intern/ student involved with case: no

## 2023-06-18 ENCOUNTER — Other Ambulatory Visit: Payer: Self-pay

## 2023-06-18 ENCOUNTER — Encounter: Payer: Self-pay | Admitting: Pharmacist

## 2023-06-28 DIAGNOSIS — Z6827 Body mass index (BMI) 27.0-27.9, adult: Secondary | ICD-10-CM | POA: Diagnosis not present

## 2023-06-28 DIAGNOSIS — Z01419 Encounter for gynecological examination (general) (routine) without abnormal findings: Secondary | ICD-10-CM | POA: Diagnosis not present

## 2023-06-28 DIAGNOSIS — Z1231 Encounter for screening mammogram for malignant neoplasm of breast: Secondary | ICD-10-CM | POA: Diagnosis not present

## 2023-07-04 ENCOUNTER — Encounter (HOSPITAL_BASED_OUTPATIENT_CLINIC_OR_DEPARTMENT_OTHER): Payer: Self-pay | Admitting: Surgery

## 2023-07-04 ENCOUNTER — Other Ambulatory Visit: Payer: Self-pay

## 2023-07-04 NOTE — Progress Notes (Signed)
   07/04/23 1135  Pre-op Phone Call  Surgery Date Verified 07/13/23  Arrival Time Verified 1230  Surgery Location Verified [facility]  Medical History Reviewed Yes  Is the patient taking a GLP-1 receptor agonist? No  Does the patient have diabetes? No diagnosis of diabetes  Do you have a history of heart problems? No  Does patient have other implanted devices? No  Patient educated about smoking cessation 24 hours prior to surgery. N/A Non-Smoker  Med Rec Completed Yes  Take the Following Meds the Morning of Surgery synthroid with sip water; no nsaid/herb/vit x5d  Recent  Lab Work, EKG, CXR? No  NPO (Including gum & candy) 4 hrs before (stop cl liq by 0830 (5 hr))  Allowed clear liquids Water;Black Coffee Only (no creamer, milk or cream including half and half);Gatorade  (diabetics please choose diet or no sugar options);Carbonated beverages - (diabetics please choose diet or no sugar options) (stop clear liq by 0830 (5hr prior to surgery time))  Patient instructed to stop clear liquids including Carb loading drink at: 0830  Stop Solids, Milk, Candy, and Gum STARTING AT MIDNIGHT  Responsible adult to drive and be with you for 24 hours? Yes  Name & Phone Number for Ride/Caregiver husband, Aneta Mins  No Jewelry, money, nail polish or make-up.  No lotions, powders, perfumes. No shaving  48 hrs. prior to surgery. Yes  Contacts, Dentures & Glasses Will Have to be Removed Before OR. Yes  Please bring your ID and Insurance Card the morning of your surgery. (Surgery Centers Only) Yes  Bring any papers or x-rays with you that your surgeon gave you. Yes  Instructed to contact the location of procedure/ provider if they or anyone in their household develops symptoms or tests positive for COVID-19, has close contact with someone who tests positive for COVID, or has known exposure to any contagious illness. Yes  Call this number the morning of surgery  with any problems that may cancel your surgery.  [phone]  Covid-19 Assessment  Have you had a positive COVID-19 test within the previous 90 days? No  COVID Testing Guidance Proceed with the additional questions.  Patient's surgery required a COVID-19 test (cardiothoracic, complex ENT, and bronchoscopies/ EBUS) No  Have you been unmasked and in close contact with anyone with COVID-19 or COVID-19 symptoms within the past 10 days? No  Do you or anyone in your household currently have any COVID-19 symptoms? No

## 2023-07-12 ENCOUNTER — Ambulatory Visit: Payer: Self-pay | Admitting: Surgery

## 2023-07-12 DIAGNOSIS — Z30433 Encounter for removal and reinsertion of intrauterine contraceptive device: Secondary | ICD-10-CM | POA: Diagnosis not present

## 2023-07-13 ENCOUNTER — Encounter (HOSPITAL_BASED_OUTPATIENT_CLINIC_OR_DEPARTMENT_OTHER): Payer: Self-pay | Admitting: Surgery

## 2023-07-13 ENCOUNTER — Encounter (HOSPITAL_BASED_OUTPATIENT_CLINIC_OR_DEPARTMENT_OTHER)
Admission: RE | Disposition: A | Discharge status: Home / Self Care | Payer: Self-pay | Source: Home / Self Care | Attending: Surgery

## 2023-07-13 ENCOUNTER — Ambulatory Visit (HOSPITAL_BASED_OUTPATIENT_CLINIC_OR_DEPARTMENT_OTHER): Payer: 59 | Admitting: Anesthesiology

## 2023-07-13 ENCOUNTER — Ambulatory Visit (HOSPITAL_BASED_OUTPATIENT_CLINIC_OR_DEPARTMENT_OTHER)
Admission: RE | Admit: 2023-07-13 | Discharge: 2023-07-13 | Disposition: A | Discharge status: Home / Self Care | Payer: 59 | Attending: Surgery | Admitting: Surgery

## 2023-07-13 ENCOUNTER — Other Ambulatory Visit: Payer: Self-pay

## 2023-07-13 ENCOUNTER — Other Ambulatory Visit (HOSPITAL_COMMUNITY): Payer: Self-pay

## 2023-07-13 DIAGNOSIS — L7211 Pilar cyst: Secondary | ICD-10-CM | POA: Insufficient documentation

## 2023-07-13 DIAGNOSIS — J45909 Unspecified asthma, uncomplicated: Secondary | ICD-10-CM | POA: Diagnosis not present

## 2023-07-13 DIAGNOSIS — R2231 Localized swelling, mass and lump, right upper limb: Secondary | ICD-10-CM

## 2023-07-13 DIAGNOSIS — Z419 Encounter for procedure for purposes other than remedying health state, unspecified: Secondary | ICD-10-CM

## 2023-07-13 HISTORY — DX: Hypothyroidism, unspecified: E03.9

## 2023-07-13 LAB — POCT PREGNANCY, URINE: Preg Test, Ur: NEGATIVE

## 2023-07-13 SURGERY — EXCISION, MASS, UPPER EXTREMITY
Anesthesia: General | Site: Arm Upper | Laterality: Right

## 2023-07-13 MED ORDER — OXYCODONE HCL 5 MG PO TABS
5.0000 mg | ORAL_TABLET | Freq: Three times a day (TID) | ORAL | 0 refills | Status: DC | PRN
Start: 2023-07-13 — End: 2024-06-27
  Filled 2023-07-13: qty 10, 4d supply, fill #0

## 2023-07-13 MED ORDER — FENTANYL CITRATE (PF) 100 MCG/2ML IJ SOLN
INTRAMUSCULAR | Status: DC | PRN
  Administered 2023-07-13 (×2): 50 ug via INTRAVENOUS

## 2023-07-13 MED ORDER — OXYCODONE HCL 5 MG PO TABS: 5.0000 mg | ORAL_TABLET | Freq: Once | ORAL | Status: DC | PRN

## 2023-07-13 MED ORDER — ONDANSETRON HCL 4 MG/2ML IJ SOLN: 4.0000 mg | Freq: Four times a day (QID) | INTRAMUSCULAR | Status: DC | PRN

## 2023-07-13 MED ORDER — CHLORHEXIDINE GLUCONATE CLOTH 2 % EX PADS: 6.0000 | MEDICATED_PAD | Freq: Once | CUTANEOUS | Status: DC

## 2023-07-13 MED ORDER — ONDANSETRON HCL 4 MG/2ML IJ SOLN
INTRAMUSCULAR | Status: AC
  Filled 2023-07-13: qty 2

## 2023-07-13 MED ORDER — BUPIVACAINE LIPOSOME 1.3 % IJ SUSP
INTRAMUSCULAR | Status: DC | PRN
  Administered 2023-07-13: 20 mL via SURGICAL_CAVITY

## 2023-07-13 MED ORDER — CEFAZOLIN SODIUM-DEXTROSE 2-4 GM/100ML-% IV SOLN
2.0000 g | INTRAVENOUS | Status: AC
  Administered 2023-07-13: 2 g via INTRAVENOUS

## 2023-07-13 MED ORDER — ENOXAPARIN SODIUM 40 MG/0.4ML IJ SOSY
40.0000 mg | PREFILLED_SYRINGE | Freq: Once | INTRAMUSCULAR | Status: AC
  Administered 2023-07-13: 40 mg via SUBCUTANEOUS

## 2023-07-13 MED ORDER — FENTANYL CITRATE (PF) 100 MCG/2ML IJ SOLN: 25.0000 ug | INTRAMUSCULAR | Status: DC | PRN

## 2023-07-13 MED ORDER — MIDAZOLAM HCL 2 MG/2ML IJ SOLN
INTRAMUSCULAR | Status: AC
  Filled 2023-07-13: qty 2

## 2023-07-13 MED ORDER — OXYCODONE HCL 5 MG/5ML PO SOLN: 5.0000 mg | Freq: Once | ORAL | Status: DC | PRN

## 2023-07-13 MED ORDER — ONDANSETRON HCL 4 MG/2ML IJ SOLN
INTRAMUSCULAR | Status: DC | PRN
  Administered 2023-07-13: 4 mg via INTRAVENOUS

## 2023-07-13 MED ORDER — FENTANYL CITRATE (PF) 100 MCG/2ML IJ SOLN
INTRAMUSCULAR | Status: AC
  Filled 2023-07-13: qty 2

## 2023-07-13 MED ORDER — PROPOFOL 10 MG/ML IV BOLUS
INTRAVENOUS | Status: DC | PRN
  Administered 2023-07-13: 10 mg via INTRAVENOUS

## 2023-07-13 MED ORDER — PROPOFOL 500 MG/50ML IV EMUL
INTRAVENOUS | Status: DC | PRN
Start: 2023-07-13 — End: 2023-07-13
  Administered 2023-07-13: 110 ug/kg/min via INTRAVENOUS

## 2023-07-13 MED ORDER — ENOXAPARIN SODIUM 40 MG/0.4ML IJ SOSY
PREFILLED_SYRINGE | INTRAMUSCULAR | Status: AC
  Filled 2023-07-13: qty 0.4

## 2023-07-13 MED ORDER — IBUPROFEN 600 MG PO TABS
600.0000 mg | ORAL_TABLET | Freq: Four times a day (QID) | ORAL | 1 refills | Status: AC
  Filled 2023-07-13: qty 120, 30d supply, fill #0

## 2023-07-13 MED ORDER — CEFAZOLIN SODIUM-DEXTROSE 2-4 GM/100ML-% IV SOLN
INTRAVENOUS | Status: AC
  Filled 2023-07-13: qty 100

## 2023-07-13 MED ORDER — PROPOFOL 10 MG/ML IV BOLUS
INTRAVENOUS | Status: AC
  Filled 2023-07-13: qty 20

## 2023-07-13 MED ORDER — LACTATED RINGERS IV SOLN: INTRAVENOUS | Status: DC

## 2023-07-13 MED ORDER — ACETAMINOPHEN 500 MG PO TABS
1000.0000 mg | ORAL_TABLET | Freq: Four times a day (QID) | ORAL | 3 refills | Status: AC
  Filled 2023-07-13: qty 100, 13d supply, fill #0

## 2023-07-13 MED ORDER — LIDOCAINE HCL (CARDIAC) PF 100 MG/5ML IV SOSY
PREFILLED_SYRINGE | INTRAVENOUS | Status: DC | PRN
  Administered 2023-07-13: 50 mg via INTRAVENOUS

## 2023-07-13 MED ORDER — MIDAZOLAM HCL 5 MG/5ML IJ SOLN
INTRAMUSCULAR | Status: DC | PRN
  Administered 2023-07-13 (×2): 1 mg via INTRAVENOUS

## 2023-07-13 MED ORDER — PROPOFOL 500 MG/50ML IV EMUL
INTRAVENOUS | Status: AC
  Filled 2023-07-13: qty 50

## 2023-07-13 MED ORDER — METHOCARBAMOL 750 MG PO TABS
750.0000 mg | ORAL_TABLET | Freq: Four times a day (QID) | ORAL | 1 refills | Status: DC
  Filled 2023-07-13: qty 120, 30d supply, fill #0

## 2023-07-13 SURGICAL SUPPLY — 48 items
ADH SKN CLS APL DERMABOND .7 (GAUZE/BANDAGES/DRESSINGS) ×1
APL PRP STRL LF DISP 70% ISPRP (MISCELLANEOUS) ×1
BLADE CLIPPER SURG (BLADE) IMPLANT
BLADE SURG 10 STRL SS (BLADE) ×1 IMPLANT
BLADE SURG 15 STRL LF DISP TIS (BLADE) ×1 IMPLANT
BLADE SURG 15 STRL SS (BLADE) ×1
CANISTER SUCT 1200ML W/VALVE (MISCELLANEOUS) IMPLANT
CHLORAPREP W/TINT 26 (MISCELLANEOUS) ×1 IMPLANT
CLSR STERI-STRIP ANTIMIC 1/2X4 (GAUZE/BANDAGES/DRESSINGS) ×1 IMPLANT
COVER BACK TABLE 60X90IN (DRAPES) ×1 IMPLANT
COVER MAYO STAND STRL (DRAPES) ×1 IMPLANT
DERMABOND ADVANCED .7 DNX12 (GAUZE/BANDAGES/DRESSINGS) ×1 IMPLANT
DRAPE LAPAROTOMY 100X72 PEDS (DRAPES) ×1 IMPLANT
DRAPE UTILITY XL STRL (DRAPES) ×1 IMPLANT
DRSG TEGADERM 4X4.75 (GAUZE/BANDAGES/DRESSINGS) IMPLANT
ELECT COATED BLADE 2.86 ST (ELECTRODE) IMPLANT
ELECT REM PT RETURN 9FT ADLT (ELECTROSURGICAL) ×1
ELECTRODE REM PT RTRN 9FT ADLT (ELECTROSURGICAL) ×1 IMPLANT
GAUZE PACKING IODOFORM 1/4X15 (PACKING) IMPLANT
GAUZE SPONGE 4X4 12PLY STRL LF (GAUZE/BANDAGES/DRESSINGS) IMPLANT
GLOVE BIO SURGEON STRL SZ 6.5 (GLOVE) ×1 IMPLANT
GLOVE BIOGEL PI IND STRL 6 (GLOVE) ×1 IMPLANT
GOWN STRL REUS W/ TWL LRG LVL3 (GOWN DISPOSABLE) ×2 IMPLANT
GOWN STRL REUS W/TWL LRG LVL3 (GOWN DISPOSABLE) ×2
NDL HYPO 25X1 1.5 SAFETY (NEEDLE) ×1 IMPLANT
NEEDLE HYPO 25X1 1.5 SAFETY (NEEDLE) ×1
NS IRRIG 1000ML POUR BTL (IV SOLUTION) IMPLANT
PACK BASIN DAY SURGERY FS (CUSTOM PROCEDURE TRAY) ×1 IMPLANT
PENCIL SMOKE EVACUATOR (MISCELLANEOUS) ×1 IMPLANT
SLEEVE SCD COMPRESS KNEE MED (STOCKING) ×1 IMPLANT
SPIKE FLUID TRANSFER (MISCELLANEOUS) IMPLANT
SPONGE T-LAP 18X18 ~~LOC~~+RFID (SPONGE) ×1 IMPLANT
SUT ETHILON 2 0 FS 18 (SUTURE) IMPLANT
SUT MNCRL AB 4-0 PS2 18 (SUTURE) ×1 IMPLANT
SUT SILK 2 0 SH (SUTURE) IMPLANT
SUT VIC AB 2-0 SH 27 (SUTURE)
SUT VIC AB 2-0 SH 27XBRD (SUTURE) IMPLANT
SUT VIC AB 3-0 SH 27 (SUTURE)
SUT VIC AB 3-0 SH 27X BRD (SUTURE) IMPLANT
SUT VICRYL 3-0 CR8 SH (SUTURE) IMPLANT
SWAB COLLECTION DEVICE MRSA (MISCELLANEOUS) IMPLANT
SWAB CULTURE ESWAB REG 1ML (MISCELLANEOUS) IMPLANT
SYR BULB IRRIG 60ML STRL (SYRINGE) ×1 IMPLANT
SYR CONTROL 10ML LL (SYRINGE) ×1 IMPLANT
TOWEL GREEN STERILE FF (TOWEL DISPOSABLE) ×1 IMPLANT
TUBE CONNECTING 20X1/4 (TUBING) IMPLANT
UNDERPAD 30X36 HEAVY ABSORB (UNDERPADS AND DIAPERS) IMPLANT
YANKAUER SUCT BULB TIP NO VENT (SUCTIONS) IMPLANT

## 2023-07-13 NOTE — H&P (Signed)
Allison Hicks is an 37 y.o. female.   HPI: 26F with soft tissue mass of RUE. Plan for excision today. The patient has had no hospitalizations, ER visits, surgeries, or newly diagnosed allergies since being seen in the office. Saw OB for regularly scheduled visit for Mirena exchange.    Past Medical History:  Diagnosis Date   Allergy    Asthma    Family history of breast cancer 06/13/2023   Family history of glioblastoma    Headache    Hypothyroidism    Migraines     Past Surgical History:  Procedure Laterality Date   WISDOM TOOTH EXTRACTION      Family History  Problem Relation Age of Onset   Breast cancer Mother 55   Thyroid disease Sister    Brain cancer Brother 41       Glioblastoma, d. 55   Down syndrome Brother    Diabetes Maternal Aunt    Breast cancer Maternal Aunt 54   Thyroid disease Maternal Aunt    Breast cancer Maternal Aunt 72   Other Paternal Uncle        MVA   Thyroid disease Maternal Grandmother    Diabetes Maternal Grandfather    Hyperlipidemia Paternal Grandfather    Parkinson's disease Paternal Grandfather    Brain cancer Cousin        dx. in her 20s-30s, benign brain tumor, ?? Meningioma.  reportedly neg GT    Social History:  reports that she has never smoked. She has never used smokeless tobacco. She reports that she does not drink alcohol and does not use drugs.  Allergies:  Allergies  Allergen Reactions   Amoxicillin-Pot Clavulanate Hives and Nausea And Vomiting   Fluconazole Hives   Penicillins Hives and Nausea And Vomiting    Has patient had a PCN reaction causing immediate rash, facial/tongue/throat swelling, SOB or lightheadedness with hypotension: No Has patient had a PCN reaction causing severe rash involving mucus membranes or skin necrosis: No Has patient had a PCN reaction that required hospitalization: No Has patient had a PCN reaction occurring within the last 10 years: Yes If all of the above answers are "NO", then may  proceed with Cephalosporin use.   Sulfa Antibiotics Hives    Medications: I have reviewed the patient's current medications.  Results for orders placed or performed during the hospital encounter of 07/13/23 (from the past 48 hour(s))  Pregnancy, urine POC     Status: None   Collection Time: 07/13/23 11:24 AM  Result Value Ref Range   Preg Test, Ur NEGATIVE NEGATIVE    Comment:        THE SENSITIVITY OF THIS METHODOLOGY IS >24 mIU/mL     No results found.  ROS 10 point review of systems is negative except as listed above in HPI.   Physical Exam Blood pressure 105/78, pulse 78, temperature 98 F (36.7 C), temperature source Temporal, resp. rate 12, height 5\' 6"  (1.676 m), weight 75 kg, SpO2 100%. Constitutional: well-developed, well-nourished HEENT: pupils equal, round, reactive to light, 2mm b/l, moist conjunctiva, external inspection of ears and nose normal, hearing intact Oropharynx: normal oropharyngeal mucosa, normal dentition Neck: no thyromegaly, trachea midline, no midline cervical tenderness to palpation Chest: breath sounds equal bilaterally, normal respiratory effort, no midline or lateral chest wall tenderness to palpation/deformity Abdomen: soft, NT, no bruising, no hepatosplenomegaly GU: normal female genitalia  Back: no wounds, no thoracic/lumbar spine tenderness to palpation, no thoracic/lumbar spine stepoffs Rectal: deferred Extremities: 2+  radial and pedal pulses bilaterally, intact motor and sensation bilateral UE and LE, no peripheral edema MSK: unable to assess gait/station, no clubbing/cyanosis of fingers/toes, normal ROM of all four extremities, 1cm soft tissue mass of RUE Skin: warm, dry, no rashes Psych: normal memory, normal mood/affect     Assessment/Plan: Soft tissue mass of RUE  Soft tissue mass of RUE - excision today. Informed consent was obtained after detailed explanation of risks, including bleeding, infection, hematoma/seroma, temporary or  permanent neuropathy, recurrence, and mesh infection requiring explantation. All questions answered to the patient's satisfaction. FEN - strict NPO DVT - SCDs, LMWH Dispo -  home post-op     Diamantina Monks, MD General and Trauma Surgery [facility]

## 2023-07-13 NOTE — Op Note (Signed)
   Operative Note   Date: 07/13/2023  Procedure: excision of soft tissue mass right upper extremity  Pre-op diagnosis: soft tissue mass, right upper extremity Post-op diagnosis:  soft tissue mass, right upper extremity, 1x1cm  Indication and clinical history: The patient is a 37 y.o. year old female with  soft tissue mass, right upper extremity     Surgeon: Diamantina Monks, MD  Anesthesiologist: Chaney Malling, MD Anesthesia: General  Findings:  Specimen:  soft tissue mass, right upper extremity EBL: <5cc Drains/Implants: none  Disposition: PACU - hemodynamically stable.  Description of procedure: The patient was positioned supine on the operating room table. General anesthetic induction and intubation were uneventful. Foley catheter insertion was performed and was atraumatic. Time-out was performed verifying correct patient, procedure, signature of informed consent, and administration of pre-operative antibiotics. The right upper extremity was prepped and draped in the usual sterile fashion.  An elliptical incision was made around the mass and the mass excised circumferentially using electrocautery. The excised tissue was sent to pathology as a permanent specimen. Local anesthetic was infiltrated and the wound bed irrigated. The wound was closed with 2-0 vicryl sutures deep and 4-0 monocryl superficially. Dermabond was applied as sterile dressing.   All sponge and instrument counts were correct at the conclusion of the procedure. The patient was awakened from anesthesia, extubated uneventfully, and transported to the PACU in good condition. There were no complications.    Diamantina Monks, MD General and Trauma Surgery [facility]

## 2023-07-13 NOTE — Anesthesia Preprocedure Evaluation (Signed)
Anesthesia Evaluation  Patient identified by MRN, date of birth, ID band Patient awake    Reviewed: Allergy & Precautions, H&P , NPO status , Patient's Chart, lab work & pertinent test results  Airway Mallampati: II   Neck ROM: full    Dental   Pulmonary asthma    breath sounds clear to auscultation       Cardiovascular negative cardio ROS  Rhythm:regular Rate:Normal     Neuro/Psych  Headaches  Anxiety        GI/Hepatic   Endo/Other  Hypothyroidism    Renal/GU      Musculoskeletal   Abdominal   Peds  Hematology   Anesthesia Other Findings   Reproductive/Obstetrics                             Anesthesia Physical Anesthesia Plan  ASA: 2  Anesthesia Plan: General   Post-op Pain Management:    Induction: Intravenous  PONV Risk Score and Plan: 3 and Ondansetron, Dexamethasone, Midazolam and Treatment may vary due to age or medical condition  Airway Management Planned: Oral ETT  Additional Equipment:   Intra-op Plan:   Post-operative Plan: Extubation in OR  Informed Consent: I have reviewed the patients History and Physical, chart, labs and discussed the procedure including the risks, benefits and alternatives for the proposed anesthesia with the patient or authorized representative who has indicated his/her understanding and acceptance.     Dental advisory given  Plan Discussed with: CRNA, Anesthesiologist and Surgeon  Anesthesia Plan Comments:        Anesthesia Quick Evaluation

## 2023-07-13 NOTE — Transfer of Care (Signed)
Immediate Anesthesia Transfer of Care Note  Patient: Allison Hicks  Procedure(s) Performed: EXCISION OF RIGHT UPPER EXREMITY SOFT TISSUE MASS (Right: Arm Upper)  Patient Location: PACU  Anesthesia Type:MAC  Level of Consciousness: awake, alert , oriented, and patient cooperative  Airway & Oxygen Therapy: Patient Spontanous Breathing and Patient connected to face mask oxygen  Post-op Assessment: Report given to RN and Post -op Vital signs reviewed and stable  Post vital signs: Reviewed and stable  Last Vitals:  Vitals Value Taken Time  BP 103/69 07/13/23 1241  Temp    Pulse 85 07/13/23 1243  Resp 17 07/13/23 1243  SpO2 100 % 07/13/23 1243  Vitals shown include unfiled device data.  Last Pain:  Vitals:   07/13/23 1131  TempSrc: Temporal  PainSc: 0-No pain      Patients Stated Pain Goal: 3 (07/13/23 1131)  Complications: No notable events documented.

## 2023-07-16 LAB — SURGICAL PATHOLOGY

## 2023-07-23 NOTE — Anesthesia Postprocedure Evaluation (Signed)
Anesthesia Post Note  Patient: Allison Hicks  Procedure(s) Performed: EXCISION OF RIGHT UPPER EXREMITY SOFT TISSUE MASS (Right: Arm Upper)     Patient location during evaluation: PACU Anesthesia Type: General Level of consciousness: awake and alert Pain management: pain level controlled Vital Signs Assessment: post-procedure vital signs reviewed and stable Respiratory status: spontaneous breathing, nonlabored ventilation, respiratory function stable and patient connected to nasal cannula oxygen Cardiovascular status: stable and blood pressure returned to baseline Postop Assessment: no apparent nausea or vomiting Anesthetic complications: no   No notable events documented.  Last Vitals:  Vitals:   07/13/23 1300 07/13/23 1315  BP: 103/66 117/79  Pulse: 71 69  Resp: 13 16  Temp:  36.7 C  SpO2: 98% 99%    Last Pain:  Vitals:   07/16/23 0941  TempSrc:   PainSc: 0-No pain                 [name]

## 2023-08-02 ENCOUNTER — Other Ambulatory Visit (HOSPITAL_BASED_OUTPATIENT_CLINIC_OR_DEPARTMENT_OTHER): Payer: Self-pay

## 2023-08-02 MED ORDER — INFLUENZA VIRUS VACC SPLIT PF (FLUZONE) 0.5 ML IM SUSY
0.5000 mL | PREFILLED_SYRINGE | Freq: Once | INTRAMUSCULAR | 0 refills | Status: AC
  Filled 2023-08-16: qty 0.5, 1d supply, fill #0

## 2023-08-16 ENCOUNTER — Other Ambulatory Visit (HOSPITAL_BASED_OUTPATIENT_CLINIC_OR_DEPARTMENT_OTHER): Payer: Self-pay

## 2023-09-12 ENCOUNTER — Other Ambulatory Visit (HOSPITAL_COMMUNITY): Payer: Self-pay

## 2023-09-12 ENCOUNTER — Other Ambulatory Visit: Payer: Self-pay

## 2023-09-12 ENCOUNTER — Other Ambulatory Visit: Payer: Self-pay | Admitting: Nurse Practitioner

## 2023-09-12 MED ORDER — LEVOTHYROXINE SODIUM 88 MCG PO TABS
88.0000 ug | ORAL_TABLET | Freq: Every day | ORAL | 0 refills | Status: DC
  Filled 2023-09-12: qty 90, 90d supply, fill #0

## 2023-12-12 ENCOUNTER — Encounter: Payer: Self-pay | Admitting: Nurse Practitioner

## 2023-12-12 DIAGNOSIS — E559 Vitamin D deficiency, unspecified: Secondary | ICD-10-CM

## 2023-12-12 DIAGNOSIS — E063 Autoimmune thyroiditis: Secondary | ICD-10-CM

## 2023-12-12 DIAGNOSIS — R5382 Chronic fatigue, unspecified: Secondary | ICD-10-CM

## 2023-12-20 ENCOUNTER — Other Ambulatory Visit: Payer: Self-pay | Admitting: Nurse Practitioner

## 2023-12-20 ENCOUNTER — Other Ambulatory Visit: Payer: Self-pay

## 2023-12-20 ENCOUNTER — Other Ambulatory Visit (HOSPITAL_COMMUNITY): Payer: Self-pay

## 2023-12-20 DIAGNOSIS — R5382 Chronic fatigue, unspecified: Secondary | ICD-10-CM | POA: Diagnosis not present

## 2023-12-20 DIAGNOSIS — E063 Autoimmune thyroiditis: Secondary | ICD-10-CM | POA: Diagnosis not present

## 2023-12-20 DIAGNOSIS — E559 Vitamin D deficiency, unspecified: Secondary | ICD-10-CM | POA: Diagnosis not present

## 2023-12-20 MED ORDER — LEVOTHYROXINE SODIUM 88 MCG PO TABS
88.0000 ug | ORAL_TABLET | Freq: Every day | ORAL | 0 refills | Status: DC
  Filled 2023-12-20: qty 90, 90d supply, fill #0

## 2023-12-21 ENCOUNTER — Encounter: Payer: Self-pay | Admitting: Nurse Practitioner

## 2023-12-21 LAB — CBC WITH DIFFERENTIAL/PLATELET
Basophils Absolute: 0.1 10*3/uL (ref 0.0–0.2)
Basos: 1 %
EOS (ABSOLUTE): 0.1 10*3/uL (ref 0.0–0.4)
Eos: 2 %
Hematocrit: 43 % (ref 34.0–46.6)
Hemoglobin: 15.1 g/dL (ref 11.1–15.9)
Immature Grans (Abs): 0 10*3/uL (ref 0.0–0.1)
Immature Granulocytes: 0 %
Lymphocytes Absolute: 1.9 10*3/uL (ref 0.7–3.1)
Lymphs: 31 %
MCH: 31.5 pg (ref 26.6–33.0)
MCHC: 35.1 g/dL (ref 31.5–35.7)
MCV: 90 fL (ref 79–97)
Monocytes Absolute: 0.4 10*3/uL (ref 0.1–0.9)
Monocytes: 7 %
Neutrophils Absolute: 3.5 10*3/uL (ref 1.4–7.0)
Neutrophils: 59 %
Platelets: 253 10*3/uL (ref 150–450)
RBC: 4.79 x10E6/uL (ref 3.77–5.28)
RDW: 12 % (ref 11.7–15.4)
WBC: 6 10*3/uL (ref 3.4–10.8)

## 2023-12-21 LAB — COMPREHENSIVE METABOLIC PANEL
ALT: 14 [IU]/L (ref 0–32)
AST: 17 [IU]/L (ref 0–40)
Albumin: 4.8 g/dL (ref 3.9–4.9)
Alkaline Phosphatase: 62 [IU]/L (ref 44–121)
BUN/Creatinine Ratio: 12 (ref 9–23)
BUN: 10 mg/dL (ref 6–20)
Bilirubin Total: 0.5 mg/dL (ref 0.0–1.2)
CO2: 22 mmol/L (ref 20–29)
Calcium: 10 mg/dL (ref 8.7–10.2)
Chloride: 102 mmol/L (ref 96–106)
Creatinine, Ser: 0.81 mg/dL (ref 0.57–1.00)
Globulin, Total: 2.3 g/dL (ref 1.5–4.5)
Glucose: 90 mg/dL (ref 70–99)
Potassium: 5 mmol/L (ref 3.5–5.2)
Sodium: 139 mmol/L (ref 134–144)
Total Protein: 7.1 g/dL (ref 6.0–8.5)
eGFR: 95 mL/min/{1.73_m2} (ref >59)

## 2023-12-21 LAB — LIPID PANEL
Chol/HDL Ratio: 3.4 {ratio} (ref 0.0–4.4)
Cholesterol, Total: 182 mg/dL (ref 100–199)
HDL: 53 mg/dL (ref >39)
LDL Chol Calc (NIH): 113 mg/dL — ABNORMAL HIGH (ref 0–99)
Triglycerides: 85 mg/dL (ref 0–149)
VLDL Cholesterol Cal: 16 mg/dL (ref 5–40)

## 2023-12-21 LAB — TSH: TSH: 3.36 u[IU]/mL (ref 0.450–4.500)

## 2023-12-21 LAB — T4, FREE: Free T4: 1.82 ng/dL — ABNORMAL HIGH (ref 0.82–1.77)

## 2023-12-21 LAB — VITAMIN D 25 HYDROXY (VIT D DEFICIENCY, FRACTURES): Vit D, 25-Hydroxy: 51.8 ng/mL (ref 30.0–100.0)

## 2024-03-11 ENCOUNTER — Other Ambulatory Visit (HOSPITAL_COMMUNITY): Payer: Self-pay

## 2024-03-11 ENCOUNTER — Other Ambulatory Visit: Payer: Self-pay | Admitting: Nurse Practitioner

## 2024-03-11 MED ORDER — LEVOTHYROXINE SODIUM 88 MCG PO TABS
88.0000 ug | ORAL_TABLET | Freq: Every day | ORAL | 0 refills | Status: DC
  Filled 2024-03-11: qty 30, 30d supply, fill #0
  Filled 2024-04-17 (×2): qty 30, 30d supply, fill #1

## 2024-03-18 DIAGNOSIS — H5213 Myopia, bilateral: Secondary | ICD-10-CM | POA: Diagnosis not present

## 2024-04-17 ENCOUNTER — Other Ambulatory Visit: Payer: Self-pay

## 2024-04-17 ENCOUNTER — Other Ambulatory Visit (HOSPITAL_COMMUNITY): Payer: Self-pay

## 2024-04-17 ENCOUNTER — Encounter: Payer: Self-pay | Admitting: Pharmacist

## 2024-04-17 ENCOUNTER — Other Ambulatory Visit: Payer: Self-pay | Admitting: Nurse Practitioner

## 2024-04-18 ENCOUNTER — Other Ambulatory Visit: Payer: Self-pay

## 2024-04-18 ENCOUNTER — Other Ambulatory Visit (HOSPITAL_COMMUNITY): Payer: Self-pay

## 2024-04-18 MED ORDER — LEVOTHYROXINE SODIUM 88 MCG PO TABS
88.0000 ug | ORAL_TABLET | Freq: Every day | ORAL | 0 refills | Status: DC
  Filled 2024-04-23 (×2): qty 30, 30d supply, fill #0
  Filled 2024-05-21: qty 30, 30d supply, fill #1
  Filled 2024-06-16: qty 30, 30d supply, fill #2

## 2024-04-23 ENCOUNTER — Other Ambulatory Visit (HOSPITAL_COMMUNITY): Payer: Self-pay

## 2024-04-23 ENCOUNTER — Other Ambulatory Visit: Payer: Self-pay

## 2024-05-21 ENCOUNTER — Other Ambulatory Visit (HOSPITAL_COMMUNITY): Payer: Self-pay

## 2024-06-16 ENCOUNTER — Other Ambulatory Visit (HOSPITAL_COMMUNITY): Payer: Self-pay

## 2024-06-27 ENCOUNTER — Other Ambulatory Visit (HOSPITAL_COMMUNITY): Payer: Self-pay

## 2024-06-27 ENCOUNTER — Ambulatory Visit: Admitting: Internal Medicine

## 2024-06-27 ENCOUNTER — Ambulatory Visit
Admission: RE | Admit: 2024-06-27 | Discharge: 2024-06-27 | Disposition: A | Discharge status: Home / Self Care | Source: Ambulatory Visit | Attending: Internal Medicine | Admitting: Internal Medicine

## 2024-06-27 ENCOUNTER — Encounter: Payer: Self-pay | Admitting: Internal Medicine

## 2024-06-27 VITALS — BP 118/82 | HR 72 | Temp 98.9°F | Ht 66.0 in | Wt 168.0 lb

## 2024-06-27 DIAGNOSIS — E063 Autoimmune thyroiditis: Secondary | ICD-10-CM

## 2024-06-27 DIAGNOSIS — Z0000 Encounter for general adult medical examination without abnormal findings: Secondary | ICD-10-CM

## 2024-06-27 DIAGNOSIS — F418 Other specified anxiety disorders: Secondary | ICD-10-CM

## 2024-06-27 MED ORDER — ALPRAZOLAM 0.25 MG PO TABS
0.2500 mg | ORAL_TABLET | Freq: Every day | ORAL | 2 refills | Status: AC | PRN
  Filled 2024-06-27: qty 30, 30d supply, fill #0
  Filled 2024-09-19: qty 30, 30d supply, fill #1
  Filled 2024-10-21: qty 30, 30d supply, fill #2

## 2024-06-27 NOTE — Progress Notes (Signed)
 Subjective:   Patient ID: Allison Hicks, female    DOB: [birth year], 38 y.o.   MRN: [id number]  The patient is here for physical. Pertinent topics discussed: Discussed the use of AI scribe software for clinical note transcription with the patient, who gave verbal consent to proceed.  History of Present Illness Allison Hicks is a 38 year old female with thyroid  nodules who presents for an annual physical exam and evaluation of throat pressure and cough.  She experiences pressure on her throat, particularly when swallowing food, which she attributes to possible thyroid  nodules. This sensation is described as 'hard to swallow food' and is accompanied by a persistent cough with mucus production. A CT scan was performed last year due to this pressure, and she has a history of thyroid  nodules, with the last ultrasound in 2021 showing a nodule that had shrunk in size. She is currently on levothyroxine , prescribed by a nurse practitioner in [place].  She experiences occasional postnasal drip and takes Zyrtec as needed, though it is not consistently bothersome. She reports frequent morning burping and suspects possible acid reflux, noting that she is 'very gassy in the morning.'  She has several skin concerns, including an itchy and peeling spot in her ear, redness and itchiness around her eyebrows, and a red spot near her mouth attributed to drooling in her sleep. A bump on her head appeared a week ago, initially large and painful, but now shrinking and less painful, though it has caused a small bald spot.  She reports occasional sharp chest pains and stomach pains, as well as increased bowel movements, going three times a day since working from home for the past three months. She attributes this change to possible dietary habits while working remotely.  She has a prescription for alprazolam , which she uses infrequently, particularly when traveling alone for work. Her new job is fully remote but  involves more travel.  PMH, [facility], social history reviewed and updated  Review of Systems  Constitutional: Negative.   HENT: Negative.    Eyes: Negative.   Respiratory:  Positive for cough. Negative for chest tightness and shortness of breath.   Cardiovascular:  Negative for chest pain, palpitations and leg swelling.  Gastrointestinal:  Negative for abdominal distention, abdominal pain, constipation, diarrhea, nausea and vomiting.       GERD  Musculoskeletal: Negative.   Skin: Negative.   Neurological: Negative.   Psychiatric/Behavioral: Negative.      Objective:  Physical Exam Constitutional:      Appearance: She is well-developed.  HENT:     Head: Normocephalic and atraumatic.  Cardiovascular:     Rate and Rhythm: Normal rate and regular rhythm.  Pulmonary:     Effort: Pulmonary effort is normal. No respiratory distress.     Breath sounds: Normal breath sounds. No wheezing or rales.  Abdominal:     General: Bowel sounds are normal. There is no distension.     Palpations: Abdomen is soft.     Tenderness: There is no abdominal tenderness. There is no rebound.  Musculoskeletal:     Cervical back: Normal range of motion.  Skin:    General: Skin is warm and dry.  Neurological:     Mental Status: She is alert and oriented to person, place, and time.     Coordination: Coordination normal.     Vitals:   06/27/24 0740  BP: 118/82  Pulse: 72  Temp: 98.9 F (37.2 C)  TempSrc: Oral  SpO2: 99%  Weight: 168 lb (76.2 kg)  Height: 5' 6 (1.676 m)    Assessment & Plan:

## 2024-06-27 NOTE — Assessment & Plan Note (Signed)
Flu shot yearly. Tetanus up to date. Pap smear up to date with gyn. Counseled about sun safety and mole surveillance. Counseled about the dangers of distracted driving. Given 10 year screening recommendations.

## 2024-06-27 NOTE — Assessment & Plan Note (Signed)
 Recent labs up to date with some cough and throat pressure will repeat US  thyroid  for growth.

## 2024-06-27 NOTE — Assessment & Plan Note (Signed)
 Uses alprazolam  0.25 mg daily prn and refilled today.

## 2024-07-08 ENCOUNTER — Other Ambulatory Visit: Payer: Self-pay | Admitting: Nurse Practitioner

## 2024-07-08 ENCOUNTER — Ambulatory Visit: Payer: Self-pay | Admitting: Internal Medicine

## 2024-07-08 DIAGNOSIS — E063 Autoimmune thyroiditis: Secondary | ICD-10-CM

## 2024-07-08 DIAGNOSIS — Q892 Congenital malformations of other endocrine glands: Secondary | ICD-10-CM

## 2024-07-11 ENCOUNTER — Telehealth: Payer: Self-pay | Admitting: Radiology

## 2024-07-11 ENCOUNTER — Other Ambulatory Visit: Payer: Self-pay | Admitting: Internal Medicine

## 2024-07-11 NOTE — Telephone Encounter (Signed)
 Patient received a call from [facility] stating they have not received lab orders. Please re-fax   FAX NUMBER:[phone]

## 2024-07-11 NOTE — Telephone Encounter (Signed)
 Ok to print the lab requisition and fax to [facility]

## 2024-07-11 NOTE — Telephone Encounter (Signed)
 Copied from CRM [id number]. Topic: Clinical - Lab/Test Results >> Jul 11, 2024  8:34 AM Tonda B wrote: Reason for CRM: patient would like lab order sent  to [facility] [id number] [phone]

## 2024-07-11 NOTE — Telephone Encounter (Signed)
 How would this need to be done?

## 2024-07-12 ENCOUNTER — Ambulatory Visit: Payer: Self-pay | Admitting: Nurse Practitioner

## 2024-07-12 LAB — T4, FREE: Free T4: 1.75 ng/dL (ref 0.82–1.77)

## 2024-07-12 LAB — PHOSPHORUS: Phosphorus: 2.9 mg/dL — ABNORMAL LOW (ref 3.0–4.3)

## 2024-07-12 LAB — MAGNESIUM: Magnesium: 2.2 mg/dL (ref 1.6–2.3)

## 2024-07-12 LAB — TSH: TSH: 2.09 u[IU]/mL (ref 0.450–4.500)

## 2024-07-13 LAB — COMPREHENSIVE METABOLIC PANEL WITH GFR
ALT: 14 IU/L (ref 0–32)
AST: 20 IU/L (ref 0–40)
Albumin: 4.8 g/dL (ref 3.9–4.9)
Alkaline Phosphatase: 65 IU/L (ref 44–121)
BUN/Creatinine Ratio: 10 (ref 9–23)
BUN: 8 mg/dL (ref 6–20)
Bilirubin Total: 0.7 mg/dL (ref 0.0–1.2)
CO2: 23 mmol/L (ref 20–29)
Calcium: 10 mg/dL (ref 8.7–10.2)
Chloride: 101 mmol/L (ref 96–106)
Creatinine, Ser: 0.79 mg/dL (ref 0.57–1.00)
Globulin, Total: 2.2 g/dL (ref 1.5–4.5)
Glucose: 113 mg/dL — ABNORMAL HIGH (ref 70–99)
Potassium: 4.5 mmol/L (ref 3.5–5.2)
Sodium: 139 mmol/L (ref 134–144)
Total Protein: 7 g/dL (ref 6.0–8.5)
eGFR: 98 mL/min/1.73 (ref >59)

## 2024-07-13 LAB — PTH, INTACT AND CALCIUM: PTH: 21 pg/mL (ref 15–65)

## 2024-07-16 NOTE — Progress Notes (Signed)
 Noted. PTH results are in and [facility] is reviewing and calling the patient.

## 2024-07-18 ENCOUNTER — Other Ambulatory Visit: Payer: Self-pay | Admitting: Nurse Practitioner

## 2024-07-18 ENCOUNTER — Other Ambulatory Visit (HOSPITAL_COMMUNITY): Payer: Self-pay

## 2024-07-18 DIAGNOSIS — E063 Autoimmune thyroiditis: Secondary | ICD-10-CM

## 2024-07-18 MED ORDER — LEVOTHYROXINE SODIUM 88 MCG PO TABS
88.0000 ug | ORAL_TABLET | Freq: Every day | ORAL | 3 refills | Status: AC
  Filled 2024-07-18: qty 30, 30d supply, fill #0
  Filled 2024-08-18: qty 30, 30d supply, fill #1
  Filled 2024-09-19: qty 30, 30d supply, fill #2
  Filled 2024-10-21: qty 30, 30d supply, fill #3
  Filled 2024-11-25: qty 90, 90d supply, fill #4

## 2024-08-18 ENCOUNTER — Other Ambulatory Visit (HOSPITAL_COMMUNITY): Payer: Self-pay

## 2024-09-04 ENCOUNTER — Encounter: Payer: Self-pay | Admitting: Internal Medicine

## 2024-09-04 DIAGNOSIS — R221 Localized swelling, mass and lump, neck: Secondary | ICD-10-CM

## 2024-09-08 ENCOUNTER — Other Ambulatory Visit: Payer: Self-pay | Admitting: Obstetrics and Gynecology

## 2024-09-08 DIAGNOSIS — R928 Other abnormal and inconclusive findings on diagnostic imaging of breast: Secondary | ICD-10-CM

## 2024-09-10 ENCOUNTER — Inpatient Hospital Stay
Admission: RE | Admit: 2024-09-10 | Discharge: 2024-09-10 | Attending: Obstetrics and Gynecology | Admitting: Obstetrics and Gynecology

## 2024-09-10 ENCOUNTER — Ambulatory Visit

## 2024-09-10 DIAGNOSIS — R928 Other abnormal and inconclusive findings on diagnostic imaging of breast: Secondary | ICD-10-CM

## 2024-09-19 ENCOUNTER — Other Ambulatory Visit (HOSPITAL_COMMUNITY): Payer: Self-pay

## 2024-09-19 ENCOUNTER — Other Ambulatory Visit (HOSPITAL_BASED_OUTPATIENT_CLINIC_OR_DEPARTMENT_OTHER): Payer: Self-pay

## 2024-09-19 ENCOUNTER — Other Ambulatory Visit: Payer: Self-pay

## 2024-10-21 ENCOUNTER — Other Ambulatory Visit (HOSPITAL_COMMUNITY): Payer: Self-pay

## 2024-10-21 ENCOUNTER — Other Ambulatory Visit: Payer: Self-pay

## 2024-11-25 ENCOUNTER — Other Ambulatory Visit (HOSPITAL_COMMUNITY): Payer: Self-pay

## 2024-11-25 ENCOUNTER — Other Ambulatory Visit: Payer: Self-pay
